# Patient Record
Sex: Female | Born: 1985 | Race: Black or African American | Hispanic: No | Marital: Married | Smoking: Never smoker
Health system: Southern US, Community
[De-identification: ages and names within clinical notes are randomized; demographics above are authoritative.]

## PROBLEM LIST (undated history)

## (undated) DIAGNOSIS — Z789 Other specified health status: Secondary | ICD-10-CM

## (undated) DIAGNOSIS — Z8041 Family history of malignant neoplasm of ovary: Secondary | ICD-10-CM

## (undated) DIAGNOSIS — Z803 Family history of malignant neoplasm of breast: Secondary | ICD-10-CM

## (undated) HISTORY — DX: Family history of malignant neoplasm of ovary: Z80.41

## (undated) HISTORY — PX: NO PAST SURGERIES: SHX2092

## (undated) HISTORY — DX: Family history of malignant neoplasm of breast: Z80.3

---
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
## 2006-09-17 ENCOUNTER — Encounter: Admission: RE | Admit: 2006-09-17 | Discharge: 2006-09-17 | Payer: Self-pay | Admitting: Family Medicine

## 2007-02-21 ENCOUNTER — Ambulatory Visit: Payer: Self-pay | Admitting: Internal Medicine

## 2007-10-09 ENCOUNTER — Inpatient Hospital Stay: Payer: Self-pay

## 2009-09-11 ENCOUNTER — Ambulatory Visit: Payer: Self-pay | Admitting: Obstetrics and Gynecology

## 2009-09-11 ENCOUNTER — Other Ambulatory Visit: Admission: RE | Admit: 2009-09-11 | Discharge: 2009-09-11 | Payer: Self-pay | Admitting: Obstetrics and Gynecology

## 2009-09-25 ENCOUNTER — Ambulatory Visit: Payer: Self-pay | Admitting: Obstetrics & Gynecology

## 2010-02-21 ENCOUNTER — Inpatient Hospital Stay (HOSPITAL_COMMUNITY): Admission: AD | Admit: 2010-02-21 | Discharge: 2010-02-21 | Payer: Self-pay | Admitting: Obstetrics & Gynecology

## 2010-03-19 ENCOUNTER — Ambulatory Visit: Payer: Self-pay | Admitting: Obstetrics and Gynecology

## 2010-03-21 ENCOUNTER — Encounter: Payer: Self-pay | Admitting: Obstetrics & Gynecology

## 2010-03-21 LAB — CONVERTED CEMR LAB
Clue Cells Wet Prep HPF POC: NONE SEEN
Trich, Wet Prep: NONE SEEN
WBC, Wet Prep HPF POC: NONE SEEN

## 2010-07-24 ENCOUNTER — Ambulatory Visit: Payer: Self-pay | Admitting: Family Medicine

## 2011-01-26 ENCOUNTER — Ambulatory Visit: Payer: Self-pay | Admitting: Internal Medicine

## 2011-02-23 LAB — POCT PREGNANCY, URINE: Preg Test, Ur: NEGATIVE

## 2011-02-23 LAB — URINALYSIS, ROUTINE W REFLEX MICROSCOPIC
Nitrite: NEGATIVE
Protein, ur: NEGATIVE mg/dL
Specific Gravity, Urine: 1.015 (ref 1.005–1.030)
Urobilinogen, UA: 0.2 mg/dL (ref 0.0–1.0)

## 2011-03-05 LAB — POCT PREGNANCY, URINE: Preg Test, Ur: NEGATIVE

## 2011-03-09 ENCOUNTER — Ambulatory Visit: Payer: Self-pay | Admitting: Internal Medicine

## 2011-03-30 ENCOUNTER — Ambulatory Visit: Payer: Self-pay | Admitting: Internal Medicine

## 2011-09-09 ENCOUNTER — Ambulatory Visit: Payer: Self-pay | Admitting: Family Medicine

## 2011-10-24 ENCOUNTER — Ambulatory Visit: Payer: Self-pay | Admitting: Internal Medicine

## 2011-11-05 ENCOUNTER — Ambulatory Visit: Payer: Self-pay | Admitting: Internal Medicine

## 2012-05-30 ENCOUNTER — Emergency Department: Payer: Self-pay | Admitting: Emergency Medicine

## 2012-06-04 ENCOUNTER — Emergency Department: Payer: Self-pay | Admitting: Emergency Medicine

## 2012-06-09 ENCOUNTER — Emergency Department: Payer: Self-pay | Admitting: Emergency Medicine

## 2012-10-16 ENCOUNTER — Observation Stay: Payer: Self-pay | Admitting: Obstetrics & Gynecology

## 2012-10-25 ENCOUNTER — Inpatient Hospital Stay: Payer: Self-pay | Admitting: Obstetrics and Gynecology

## 2012-10-25 LAB — CBC WITH DIFFERENTIAL/PLATELET
Basophil #: 0.1 10*3/uL (ref 0.0–0.1)
Eosinophil #: 0.2 10*3/uL (ref 0.0–0.7)
Lymphocyte %: 22.4 %
Monocyte %: 4.8 %
Neutrophil %: 70.1 %
Platelet: 233 10*3/uL (ref 150–440)
RBC: 4.14 10*6/uL (ref 3.80–5.20)
RDW: 18.1 % — ABNORMAL HIGH (ref 11.5–14.5)
WBC: 11.2 10*3/uL — ABNORMAL HIGH (ref 3.6–11.0)

## 2014-02-05 ENCOUNTER — Ambulatory Visit: Payer: Self-pay | Admitting: Physician Assistant

## 2014-02-05 LAB — URINALYSIS, COMPLETE
BILIRUBIN, UR: NEGATIVE
Glucose,UR: NEGATIVE mg/dL (ref 0–75)
Ketone: NEGATIVE
LEUKOCYTE ESTERASE: NEGATIVE
Nitrite: NEGATIVE
PROTEIN: NEGATIVE
Ph: 6 (ref 4.5–8.0)
SPECIFIC GRAVITY: 1.01 (ref 1.003–1.030)
WBC UR: NONE SEEN /HPF (ref 0–5)

## 2014-02-05 LAB — PREGNANCY, URINE: PREGNANCY TEST, URINE: NEGATIVE m[IU]/mL

## 2014-02-05 LAB — RAPID INFLUENZA A&B ANTIGENS

## 2014-02-07 LAB — URINE CULTURE

## 2014-05-28 ENCOUNTER — Emergency Department: Payer: Self-pay | Admitting: Emergency Medicine

## 2014-05-28 LAB — HEPATIC FUNCTION PANEL A (ARMC)
ALBUMIN: 3.4 g/dL (ref 3.4–5.0)
ALK PHOS: 97 U/L
Bilirubin,Total: 0.2 mg/dL (ref 0.2–1.0)
SGOT(AST): 39 U/L — ABNORMAL HIGH (ref 15–37)
SGPT (ALT): 19 U/L (ref 12–78)
Total Protein: 7.4 g/dL (ref 6.4–8.2)

## 2014-05-28 LAB — LIPASE, BLOOD: LIPASE: 208 U/L (ref 73–393)

## 2014-05-28 LAB — BASIC METABOLIC PANEL
Anion Gap: 5 — ABNORMAL LOW (ref 7–16)
BUN: 8 mg/dL (ref 7–18)
CALCIUM: 8.2 mg/dL — AB (ref 8.5–10.1)
CREATININE: 1 mg/dL (ref 0.60–1.30)
Chloride: 107 mmol/L (ref 98–107)
Co2: 28 mmol/L (ref 21–32)
EGFR (African American): >60
GLUCOSE: 92 mg/dL (ref 65–99)
Osmolality: 277 (ref 275–301)
Potassium: 3.6 mmol/L (ref 3.5–5.1)
Sodium: 140 mmol/L (ref 136–145)

## 2014-05-28 LAB — CBC
HCT: 35.8 % (ref 35.0–47.0)
HGB: 11.2 g/dL — AB (ref 12.0–16.0)
MCH: 23.1 pg — ABNORMAL LOW (ref 26.0–34.0)
MCHC: 31.3 g/dL — ABNORMAL LOW (ref 32.0–36.0)
MCV: 74 fL — ABNORMAL LOW (ref 80–100)
PLATELETS: 287 10*3/uL (ref 150–440)
RBC: 4.86 10*6/uL (ref 3.80–5.20)
RDW: 16.8 % — ABNORMAL HIGH (ref 11.5–14.5)
WBC: 15.2 10*3/uL — ABNORMAL HIGH (ref 3.6–11.0)

## 2014-05-28 LAB — URINALYSIS, COMPLETE
BILIRUBIN, UR: NEGATIVE
BLOOD: NEGATIVE
GLUCOSE, UR: NEGATIVE mg/dL (ref 0–75)
Ketone: NEGATIVE
Leukocyte Esterase: NEGATIVE
NITRITE: NEGATIVE
PROTEIN: NEGATIVE
Ph: 6 (ref 4.5–8.0)
RBC,UR: 2 /HPF (ref 0–5)
Specific Gravity: 1.014 (ref 1.003–1.030)
Squamous Epithelial: 1

## 2014-05-28 LAB — TROPONIN I: Troponin-I: <0.02 ng/mL

## 2014-06-17 ENCOUNTER — Ambulatory Visit: Payer: Self-pay

## 2014-10-03 ENCOUNTER — Ambulatory Visit: Payer: Self-pay | Admitting: Physician Assistant

## 2014-10-03 LAB — RAPID STREP-A WITH REFLX: Micro Text Report: NEGATIVE

## 2014-10-05 LAB — BETA STREP CULTURE(ARMC)

## 2015-04-09 NOTE — H&P (Signed)
L&D Evaluation:  History:   HPI 29yo G2P1001 at 2376w4d presents with c/o LOF and contractions.  Last cervical exam 1 week ago 1cm dilated.    Patient's Medical History obesity    Patient's Surgical History none    Medications Pre Serbiaatal Vitamins    Social History none    Family History Non-Contributory   ROS:   ROS All systems were reviewed.  HEENT, CNS, GI, GU, Respiratory, CV, Renal and Musculoskeletal systems were found to be normal.   Exam:   Vital Signs stable    General no apparent distress    Mental Status clear    Chest nl effort    Abdomen gravid, non-tender    Estimated Fetal Weight Average for gestational age    Pelvic 1 / 550 / -3 per RN, nitrazine negative    Mebranes Intact    FHT normal rate with no decels, reactive NST    Ucx irregular    Skin dry   Impression:   Impression False labor, not ruptured   Plan:   Plan reassurance provided, reviewed labor precautions    Follow Up Appointment already scheduled. tomorrow   Electronic Signatures: Garnette GunnerStansbury Clipp, Ali LoweEryn K (MD)  (Signed [phone]-Nov-13 13:28)  Authored: L&D Evaluation   Last Updated: 17-Nov-13 13:28 by Garnette GunnerStansbury Clipp, Ali LoweEryn K (MD)

## 2016-04-08 ENCOUNTER — Ambulatory Visit: Payer: Self-pay | Admitting: Family Medicine

## 2016-10-09 ENCOUNTER — Encounter (INDEPENDENT_AMBULATORY_CARE_PROVIDER_SITE_OTHER): Payer: Self-pay | Admitting: Podiatry

## 2016-10-09 ENCOUNTER — Ambulatory Visit: Payer: Self-pay

## 2016-10-09 DIAGNOSIS — M79671 Pain in right foot: Secondary | ICD-10-CM

## 2016-10-09 NOTE — Progress Notes (Signed)
This encounter was created in error - please disregard.

## 2017-01-05 ENCOUNTER — Ambulatory Visit: Payer: Self-pay | Admitting: Podiatry

## 2017-03-02 ENCOUNTER — Ambulatory Visit: Payer: Self-pay | Admitting: Podiatry

## 2017-05-11 ENCOUNTER — Encounter: Payer: Self-pay | Admitting: Podiatry

## 2017-06-01 ENCOUNTER — Ambulatory Visit: Payer: Self-pay | Admitting: Podiatry

## 2017-06-07 NOTE — Progress Notes (Signed)
This encounter was created in error - please disregard.

## 2018-07-15 ENCOUNTER — Other Ambulatory Visit (HOSPITAL_COMMUNITY)
Admission: RE | Admit: 2018-07-15 | Discharge: 2018-07-15 | Disposition: A | Discharge status: Home / Self Care | Payer: BC Managed Care – PPO | Source: Ambulatory Visit | Attending: Obstetrics and Gynecology | Admitting: Obstetrics and Gynecology

## 2018-07-15 ENCOUNTER — Ambulatory Visit (INDEPENDENT_AMBULATORY_CARE_PROVIDER_SITE_OTHER): Payer: BC Managed Care – PPO | Admitting: Obstetrics and Gynecology

## 2018-07-15 ENCOUNTER — Encounter: Payer: Self-pay | Admitting: Obstetrics and Gynecology

## 2018-07-15 VITALS — BP 110/76 | HR 78 | Ht 63.0 in | Wt 264.0 lb

## 2018-07-15 DIAGNOSIS — Z01419 Encounter for gynecological examination (general) (routine) without abnormal findings: Secondary | ICD-10-CM

## 2018-07-15 DIAGNOSIS — Z1239 Encounter for other screening for malignant neoplasm of breast: Secondary | ICD-10-CM

## 2018-07-15 DIAGNOSIS — Z1151 Encounter for screening for human papillomavirus (HPV): Secondary | ICD-10-CM | POA: Insufficient documentation

## 2018-07-15 DIAGNOSIS — Z1231 Encounter for screening mammogram for malignant neoplasm of breast: Secondary | ICD-10-CM | POA: Diagnosis not present

## 2018-07-15 DIAGNOSIS — Z0000 Encounter for general adult medical examination without abnormal findings: Secondary | ICD-10-CM

## 2018-07-15 DIAGNOSIS — Z124 Encounter for screening for malignant neoplasm of cervix: Secondary | ICD-10-CM | POA: Insufficient documentation

## 2018-07-15 NOTE — Progress Notes (Signed)
Gynecology Annual Exam   PCP: Marina GoodellFeldpausch, Dale E, MD  Chief Complaint:  Chief Complaint  Patient presents with  . Gynecologic Exam    History of Present Illness: Patient is a 32 y.o. [id number] presents for annual exam. The patient has no complaints today.   LMP: No LMP recorded (lmp unknown). (Menstrual status: Irregular Periods). Average Interval: regular, 30 days Duration of flow: 4 days Heavy Menses: yes Clots: no Intermenstrual Bleeding: no Postcoital Bleeding: no Dysmenorrhea: no  The patient is sexually active. She currently uses rhythm method for contraception. She denies dyspareunia.  The patient does perform self breast exams.  There is notable family history of breast or ovarian cancer in her family.  The patient wears seatbelts: yes.   The patient has regular exercise: no.    The patient denies current symptoms of depression.    Review of Systems: Review of Systems  Constitutional: Positive for malaise/fatigue. Negative for chills, fever and weight loss.  HENT: Negative for congestion, hearing loss and sinus pain.   Eyes: Negative for blurred vision and double vision.  Respiratory: Negative for cough, sputum production, shortness of breath and wheezing.   Cardiovascular: Negative for chest pain, palpitations, orthopnea and leg swelling.  Gastrointestinal: Negative for abdominal pain, constipation, diarrhea, nausea and vomiting.  Genitourinary: Negative for dysuria, flank pain, frequency, hematuria and urgency.  Musculoskeletal: Negative for back pain, falls and joint pain.  Skin: Negative for itching and rash.  Neurological: Negative for dizziness and headaches.  Psychiatric/Behavioral: Negative for depression, substance abuse and suicidal ideas. The patient is not nervous/anxious.     Past Medical History:  History reviewed. No pertinent past medical history.  Past Surgical History:  History reviewed. No pertinent surgical history.  Gynecologic History:   No LMP recorded (lmp unknown). (Menstrual status: Irregular Periods). Contraception: rhythm method Last Pap: Results were: unknown  Obstetric History: [id number]  Family History:  Family History  Problem Relation Age of Onset  . Breast cancer Maternal Aunt 30  . Ovarian cancer Maternal Grandmother [id number]  . Breast cancer Maternal Aunt 30  . Breast cancer Maternal Aunt 30    Social History:  Social History   Socioeconomic History  . Marital status: Married    Spouse name: Not on file  . Number of children: Not on file  . Years of education: Not on file  . Highest education level: Not on file  Occupational History  . Not on file  Social Needs  . Financial resource strain: Not on file  . Food insecurity:    Worry: Not on file    Inability: Not on file  . Transportation needs:    Medical: Not on file    Non-medical: Not on file  Tobacco Use  . Smoking status: Never Smoker  . Smokeless tobacco: Never Used  Substance and Sexual Activity  . Alcohol use: Not Currently  . Drug use: Never  . Sexual activity: Yes    Birth control/protection: None  Lifestyle  . Physical activity:    Days per week: Not on file    Minutes per session: Not on file  . Stress: Not on file  Relationships  . Social connections:    Talks on phone: Not on file    Gets together: Not on file    Attends religious service: Not on file    Active member of club or organization: Not on file    Attends meetings of clubs or organizations: Not on file  Relationship status: Not on file  . Intimate partner violence:    Fear of current or ex partner: Not on file    Emotionally abused: Not on file    Physically abused: Not on file    Forced sexual activity: Not on file  Other Topics Concern  . Not on file  Social History Narrative  . Not on file    Allergies:  No Known Allergies  Medications: Prior to Admission medications   Medication Sig Start Date End Date Taking? Authorizing Provider  Multiple  Vitamin (MULTI-VITAMINS) TABS Take by mouth.   Yes [provider]    Physical Exam Vitals: Blood pressure 110/76, pulse 78, height 5\' 3"  (1.6 m), weight 264 lb (119.7 kg).  General: NAD HEENT: normocephalic, anicteric Thyroid: no enlargement, no palpable nodules Pulmonary: No increased work of breathing, CTAB Cardiovascular: RRR, distal pulses 2+ Breast: Breast symmetrical, no tenderness, no palpable nodules or masses, no skin or nipple retraction present, no nipple discharge.  No axillary or supraclavicular lymphadenopathy. Abdomen: NABS, soft, non-tender, non-distended.  Umbilicus without lesions.  No hepatomegaly, splenomegaly or masses palpable. No evidence of hernia  Genitourinary:  External: Normal external female genitalia.  Normal urethral meatus, normal Bartholin's and Skene's glands.    Vagina: Normal vaginal mucosa, no evidence of prolapse.    Cervix: Grossly normal in appearance, no bleeding  Uterus: Non-enlarged, mobile, normal contour.  No CMT  Adnexa: ovaries non-enlarged, no adnexal masses  Rectal: deferred  Lymphatic: no evidence of inguinal lymphadenopathy Extremities: no edema, erythema, or tenderness Neurologic: Grossly intact Psychiatric: mood appropriate, affect full  Female chaperone present for pelvic and breast  portions of the physical exam    Assessment: 32 y.o. [id number] routine annual exam  Plan: Problem List Items Addressed This Visit    None    Visit Diagnoses    Screening for cervical cancer    -  Primary   Relevant Orders   Cytology - PAP   Health care maintenance       Relevant Orders   Cytology - PAP   Visit for pelvic exam       Encounter for screening breast examination          2) STI screening  was offered and declined  2)  ASCCP guidelines and rational discussed.  Patient opts for every 3 years screening interval  3) Contraception - the patient is currently using  rhythm method.  She is happy with her current form of  contraception and plans to continue  4) Routine healthcare maintenance including cholesterol, diabetes screening discussed managed by PCP  5) Return in about 1 year (around 07/16/2019) for next week for nurse visit with Selena BattenKim, 1 year for annual .  6)  She was offered genetic testing. She is interested in obtaining this for her family history of breast cancer. She will return to have this completed.   Adelene Idlerhristanna Lorrie Strauch MD [facility] 07/15/18 2:00 PM

## 2018-07-15 NOTE — Patient Instructions (Signed)
Eating Healthy on a Budget There are many ways to save money at the grocery store and continue to eat healthy. You can be successful if you plan your meals according to your budget, purchase according to your budget and grocery list, and prepare food yourself. How can I buy more food on a limited budget? Plan  Plan meals and snacks according to a grocery list and budget you create.  Look for recipes where you can cook once and make enough food for two meals.  Include meals that will "stretch" more expensive foods such as stews, casseroles, and stir-fry dishes.  Make a grocery list and make sure to bring it with you to the store. If you have a smart phone, you could use your phone to create your shopping list. Purchase  When grocery shopping, buy only the items on your grocery list and go only to the areas of the store that have the items on your list. Prepare  Some meal items can be prepared in advance. Pre-cook on days when you have extra time.  Make extra food (such as by doubling recipes) and freeze the extras in meal-sized containers or in individual portions for fast meals and snacks.  Use leftovers in your meal plan for the week.  Try some meatless meals or try "no cook" meals like salads.  When you come home from the grocery store, wash and prepare your fruits and vegetables so they are ready to use and eat. This will help reduce food waste. How can I buy more food on a limited budget? Try these tips the next time you go shopping:  Buy store brands or generic brands.  Use coupons only for foods and brands you normally buy. Avoid buying items you wouldn't normally buy simply because they are on sale.  Check online and in newspapers for weekly deals.  Buy healthy items from the bulk bins when available, such as herbs, spices, flours, pastas, nuts, and dried fruit.  Buy fruits and vegetables that are in season. Prices are usually lower on in-season produce.  Compare and  contrast different items. You can do this by looking at the unit price on the price tag. Use it to compare different brands and sizes to find out which item is the best deal.  Choose naturally low-cost healthy items, such as carrots, potatoes, apples, bananas, and oranges. Dried or canned beans are a low-cost protein source.  Buy in bulk and freeze extra food. Items you can buy in bulk include meats, fish, poultry, frozen fruits, and frozen vegetables.  Limit the purchase of prepared or "ready-to-eat" foods, such as pre-cut fruits and vegetables and pre-made salads.  If possible, shop around to discover which grocery store offers the best prices. Some stores charge much more than other stores for the same items.  Do not shop when you are hungry. If you shop while hungry, It may be hard to stick to your list and budget.  Stick to your list and resist impulse buys. Treat your list as your official plan for the week.  Buy a variety of vegetables and fruit by purchasing fresh, frozen, and canned items.  Look beyond eye level. Foods at eye level (adult or child eye level) are more expensive. Look at the top and bottom shelves for deals.  Be efficient with your time when shopping. The more time you spend at the store, the more money you are likely to spend.  Consider other retailers such as dollar stores, larger wholesale   stores, local fruit and vegetable stands, and farmers markets.  What are some tips for less expensive food substitutions? When choosing more expensive foods like meats and dairy, try these tips to save money:  Choose cheaper cuts of meat, such as bone-in chicken thighs and drumsticks instead skinless and boneless chicken. When you are ready to prepare the chicken, you can remove the skin yourself to make it healthier.  Choose lean meats like chicken or turkey. When choosing ground beef, make sure it is lean ground beef (92% lean, 8% fat). If you do buy a fattier ground beef,  drain the fat before eating.  Buy dried beans and peas, such as lentils, split peas, or kidney beans.  For seafood, choose canned tuna, salmon, or sardines.  Eggs are a low-cost source of protein.  Buy the larger tubs of yogurt instead of individual-sized containers.  Choose water instead of sodas and other sweetened beverages.  Skip buying chips, cookies, and other "junk food". These items are usually expensive, high in calories, and low in nutritional value.  How can I prepare the foods I buy in the healthiest way? Practice these tips for cooking foods in the healthiest way to reduce excess fat and calorie intake:  Steam, saute, grill, or bake foods instead of frying them.  Make sure half your plate is filled with fruits or vegetables. Choose from fresh, frozen, or canned fruits and vegetables. If eating canned, remember to rinse them before eating. This will remove any excess salt added for packaging.  Trim all fat from meat before cooking. Remove the skin from chicken or turkey.  Spoon off fat from meat dishes once they have been chilled in the refrigerator and the fat has hardened on the top.  Use skim milk, low-fat milk, or evaporated skim milk when making cream sauces, soups, or puddings.  Substitute low-fat yogurt, sour cream, or cottage cheese for sour cream and mayonnaise in dips and dressings.  Try lemon juice, herbs, or spices to season food instead of salt, butter, or margarine.  This information is not intended to replace advice given to you by your health care provider. Make sure you discuss any questions you have with your health care provider. Document Released: 07/20/2014 Document Revised: 06/05/2016 Document Reviewed: 06/19/2014 Elsevier Interactive Patient Education  2018 Elsevier Inc.  

## 2018-07-19 LAB — CYTOLOGY - PAP
DIAGNOSIS: NEGATIVE
HPV (WINDOPATH): NOT DETECTED

## 2018-07-19 NOTE — Progress Notes (Signed)
NIL HPV neg, released to mychart.

## 2018-07-20 ENCOUNTER — Encounter: Payer: Self-pay | Admitting: Obstetrics and Gynecology

## 2018-10-14 ENCOUNTER — Encounter: Payer: Self-pay | Admitting: Obstetrics and Gynecology

## 2018-10-14 ENCOUNTER — Ambulatory Visit (INDEPENDENT_AMBULATORY_CARE_PROVIDER_SITE_OTHER): Payer: BC Managed Care – PPO | Admitting: Obstetrics and Gynecology

## 2018-10-14 ENCOUNTER — Other Ambulatory Visit: Payer: Self-pay | Admitting: Obstetrics and Gynecology

## 2018-10-14 VITALS — BP 110/70 | HR 59 | Ht 63.0 in | Wt 264.0 lb

## 2018-10-14 DIAGNOSIS — N644 Mastodynia: Secondary | ICD-10-CM

## 2018-10-14 DIAGNOSIS — Z131 Encounter for screening for diabetes mellitus: Secondary | ICD-10-CM

## 2018-10-14 DIAGNOSIS — R3989 Other symptoms and signs involving the genitourinary system: Secondary | ICD-10-CM

## 2018-10-14 DIAGNOSIS — N926 Irregular menstruation, unspecified: Secondary | ICD-10-CM | POA: Diagnosis not present

## 2018-10-14 DIAGNOSIS — Z0000 Encounter for general adult medical examination without abnormal findings: Secondary | ICD-10-CM

## 2018-10-14 LAB — POCT URINE PREGNANCY: PREG TEST UR: NEGATIVE

## 2018-10-14 NOTE — Progress Notes (Signed)
Patient ID: Anna LoftsLutisha Pyles Tallarico, female   DOB: 26-Oct-1986, 32 y.o.   MRN: [id number]  Reason for Consult: Consult (Headaches/diarrhea and constipation/ nausea/ moody/ blood work PCOS)   Referred by Marina GoodellFeldpausch, Dale E, MD  Subjective:     HPI:   Anna Baldwin is a 32 y.o. female she presents today for a visit. She had started to have irregular periods. She is unsure of her exact LMP but it was more than 1 month ago. She says she has never been able to know when her period would come. Sometimes it would be early and sometimes late. She reports she usually had one each month.   She has had increased urinary frequency. She goes to the bathroom every 2 hours without drinking a lot of water or other beverages. She wakes up 2-3 times at night to pee. She notes that she can go a full day not peeing when doing activities.   She has joined [employer]. She lost 5 lbs but now has regained them. Finds it difficult to record the food she eats.   She notes breast tenderness and increased cervical mucus that has been present for several weeks.   Past Medical History:  Diagnosis Date  . Family history of breast cancer   . Family history of ovarian cancer    Family History  Problem Relation Age of Onset  . Breast cancer Maternal Aunt 30  . Ovarian cancer Maternal Grandmother 762  . Breast cancer Maternal Aunt 30  . Breast cancer Maternal Aunt 30   History reviewed. No pertinent surgical history.  Short Social History:  Social History   Tobacco Use  . Smoking status: Never Smoker  . Smokeless tobacco: Never Used  Substance Use Topics  . Alcohol use: Not Currently    No Known Allergies  Current Outpatient Medications  Medication Sig Dispense Refill  . Ascorbic Acid (VITAMIN C) 1000 MG tablet Take 1,000 mg by mouth daily.    . Multiple Vitamin (MULTI-VITAMINS) TABS Take by mouth.     No current facility-administered medications for this visit.     Review of Systems   Constitutional: Negative for chills, fatigue, fever and unexpected weight change.  HENT: Negative for trouble swallowing.  Eyes: Negative for loss of vision.  Respiratory: Negative for cough, shortness of breath and wheezing.  Cardiovascular: Negative for chest pain, leg swelling, palpitations and syncope.  GI: Positive for diarrhea. Negative for abdominal pain, blood in stool, nausea and vomiting.  GU: Positive for frequency. Negative for difficulty urinating, dysuria and hematuria.  Musculoskeletal: Negative for back pain, leg pain and joint pain.  Skin: Negative for rash.  Neurological: Negative for dizziness, headaches, light-headedness, numbness and seizures.  Psychiatric: Negative for behavioral problem, confusion, depressed mood and sleep disturbance.       Objective:  Objective   Vitals:   10/14/18 0854  BP: 110/70  Pulse: (!) 59  Weight: 264 lb (119.7 kg)  Height: 5\' 3"  (1.6 m)   Body mass index is 46.77 kg/m.  Physical Exam  Constitutional: She is oriented to person, place, and time. She appears well-developed and well-nourished.  HENT:  Head: Normocephalic and atraumatic.  Eyes: Pupils are equal, round, and reactive to light. EOM are normal.  Cardiovascular: Normal rate and regular rhythm.  Pulmonary/Chest: Effort normal. No respiratory distress.  Neurological: She is alert and oriented to person, place, and time.  Skin: Skin is warm and dry.  Psychiatric: She has a normal mood and  affect. Her behavior is normal. Judgment and thought content normal.  Nursing note and vitals reviewed.       Assessment/Plan:     32 yo here for a consult visit.  1. Irregular periods- urine pregnancy test negative, will evaluate for secondary amenorrhea with labs and US/ 2. Health care maintenance - will screen for diabetes 3. Urinary frequency- discussed possible voiding diary.  4. Breast tenderness- will discuss plan of management at next visit.    Adelene Idler  MD [facility], [facility] 10/14/18 4:36 PM

## 2018-10-18 LAB — NUSWAB VAGINITIS PLUS (VG+)
CANDIDA ALBICANS, NAA: NEGATIVE
Candida glabrata, NAA: NEGATIVE
Chlamydia trachomatis, NAA: NEGATIVE
NEISSERIA GONORRHOEAE, NAA: NEGATIVE
Trich vag by NAA: NEGATIVE

## 2018-10-20 LAB — BASIC METABOLIC PANEL
BUN / CREAT RATIO: 13 (ref 9–23)
BUN: 10 mg/dL (ref 6–20)
CHLORIDE: 102 mmol/L (ref 96–106)
CO2: 25 mmol/L (ref 20–29)
Calcium: 9.5 mg/dL (ref 8.7–10.2)
Creatinine, Ser: 0.77 mg/dL (ref 0.57–1.00)
GFR calc Af Amer: 120 mL/min/{1.73_m2} (ref >59)
GFR calc non Af Amer: 104 mL/min/{1.73_m2} (ref >59)
GLUCOSE: 87 mg/dL (ref 65–99)
Potassium: 4.6 mmol/L (ref 3.5–5.2)
Sodium: 140 mmol/L (ref 134–144)

## 2018-10-20 LAB — TSH+PRL+FSH+TESTT+LH+DHEA S...
17 HYDROXYPROGESTERONE: 34 ng/dL
ANDROSTENEDIONE: 105 ng/dL (ref 41–262)
DHEA SO4: 136.8 ug/dL (ref 84.8–378.0)
FSH: 5.6 m[IU]/mL
LH: 11.8 m[IU]/mL
Prolactin: 10 ng/mL (ref 4.8–23.3)
TSH: 2.78 u[IU]/mL (ref 0.450–4.500)
Testosterone, Free: 1.1 pg/mL (ref 0.0–4.2)
Testosterone: 21 ng/dL (ref 8–48)

## 2018-10-20 LAB — CBC
Hematocrit: 39.1 % (ref 34.0–46.6)
Hemoglobin: 12.3 g/dL (ref 11.1–15.9)
MCH: 24.8 pg — AB (ref 26.6–33.0)
MCHC: 31.5 g/dL (ref 31.5–35.7)
MCV: 79 fL (ref 79–97)
PLATELETS: 367 10*3/uL (ref 150–450)
RBC: 4.95 x10E6/uL (ref 3.77–5.28)
RDW: 14.8 % (ref 12.3–15.4)
WBC: 9.7 10*3/uL (ref 3.4–10.8)

## 2018-10-20 LAB — HEMOGLOBIN A1C
ESTIMATED AVERAGE GLUCOSE: 114 mg/dL
Hgb A1c MFr Bld: 5.6 % (ref 4.8–5.6)

## 2018-10-21 ENCOUNTER — Ambulatory Visit (INDEPENDENT_AMBULATORY_CARE_PROVIDER_SITE_OTHER): Payer: BC Managed Care – PPO | Admitting: Obstetrics and Gynecology

## 2018-10-21 ENCOUNTER — Encounter: Payer: Self-pay | Admitting: Obstetrics and Gynecology

## 2018-10-21 ENCOUNTER — Ambulatory Visit (INDEPENDENT_AMBULATORY_CARE_PROVIDER_SITE_OTHER): Payer: BC Managed Care – PPO

## 2018-10-21 ENCOUNTER — Other Ambulatory Visit: Payer: Self-pay | Admitting: Obstetrics and Gynecology

## 2018-10-21 VITALS — BP 110/72 | Ht 63.0 in | Wt 268.0 lb

## 2018-10-21 DIAGNOSIS — N939 Abnormal uterine and vaginal bleeding, unspecified: Secondary | ICD-10-CM

## 2018-10-21 DIAGNOSIS — N926 Irregular menstruation, unspecified: Secondary | ICD-10-CM

## 2018-10-21 NOTE — Progress Notes (Signed)
Patient ID: Anna Baldwin, female   DOB: February 20, 1986, 32 y.o.   MRN: [id number]  Reason for Consult: Follow-up   Referred by Marina Goodell, MD  Subjective:     HPI:  Anna Baldwin is a 32 y.o. female  She is following up for irregular periods and secondary amenorrhea. She had labs last week and returns today for pelvic ultrasound. She reports that over the weekend she started her period and it lasted for 3 days. She was happy and relieved by that. Her lab work was normal and today's ultrasound is also normal. She would not like to evaluate her urinary frequency at this time, she reports that she has just been drinking a lot of water.   Past Medical History:  Diagnosis Date  . Family history of breast cancer   . Family history of ovarian cancer    Family History  Problem Relation Age of Onset  . Breast cancer Maternal Aunt 30  . Ovarian cancer Maternal Grandmother 16  . Breast cancer Maternal Aunt 30  . Breast cancer Maternal Aunt 30   History reviewed. No pertinent surgical history.  Short Social History:  Social History   Tobacco Use  . Smoking status: Never Smoker  . Smokeless tobacco: Never Used  Substance Use Topics  . Alcohol use: Not Currently    No Known Allergies  Current Outpatient Medications  Medication Sig Dispense Refill  . Ascorbic Acid (VITAMIN C) 1000 MG tablet Take 1,000 mg by mouth daily.    . Multiple Vitamin (MULTI-VITAMINS) TABS Take by mouth.     No current facility-administered medications for this visit.     Review of Systems  Constitutional: Negative for chills, fatigue, fever and unexpected weight change.  HENT: Negative for trouble swallowing.  Eyes: Negative for loss of vision.  Respiratory: Negative for cough, shortness of breath and wheezing.  Cardiovascular: Negative for chest pain, leg swelling, palpitations and syncope.  GI: Negative for abdominal pain, blood in stool, diarrhea, nausea and vomiting.  GU: Negative  for difficulty urinating, dysuria, frequency and hematuria.  Musculoskeletal: Negative for back pain, leg pain and joint pain.  Skin: Negative for rash.  Neurological: Negative for dizziness, headaches, light-headedness, numbness and seizures.  Psychiatric: Negative for behavioral problem, confusion, depressed mood and sleep disturbance.        Objective:  Objective   Vitals:   10/21/18 1051  BP: 110/72  Weight: 268 lb (121.6 kg)  Height: 5\' 3"  (1.6 m)   Body mass index is 47.47 kg/m.  Physical Exam  Constitutional: She is oriented to person, place, and time. She appears well-developed and well-nourished.  HENT:  Head: Normocephalic and atraumatic.  Eyes: Pupils are equal, round, and reactive to light. EOM are normal.  Cardiovascular: Normal rate and regular rhythm.  Pulmonary/Chest: Effort normal. No respiratory distress.  Neurological: She is alert and oriented to person, place, and time.  Skin: Skin is warm and dry.  Psychiatric: She has a normal mood and affect. Her behavior is normal. Judgment and thought content normal.  Nursing note and vitals reviewed.      Assessment/Plan:       32 yo being evaluated for amenorrhea.  She had her period this weekend. PCOS work up was normal. She had a normal pelvic ultrasound.  Will start recording the first days of her periods so the number of days between her cycles can be determined. She will return if needed. She will return if she desires to evaluate  her urinary frequency.   More than 10 minutes were spent face to face with the patient in the room with more than 50% of the time spent providing counseling and discussing the plan of management. We discussed the results of her labs and ultrasound.    Adelene Idlerhristanna  MD [facility], [facility] 10/21/18 11:29 AM

## 2018-11-01 ENCOUNTER — Encounter: Payer: Self-pay | Admitting: Obstetrics and Gynecology

## 2019-07-28 ENCOUNTER — Other Ambulatory Visit: Payer: Self-pay

## 2019-07-28 ENCOUNTER — Encounter: Payer: Self-pay | Admitting: Obstetrics and Gynecology

## 2019-07-28 ENCOUNTER — Ambulatory Visit (INDEPENDENT_AMBULATORY_CARE_PROVIDER_SITE_OTHER): Payer: BC Managed Care – PPO | Admitting: Obstetrics and Gynecology

## 2019-07-28 VITALS — Wt 282.8 lb

## 2019-07-28 DIAGNOSIS — N926 Irregular menstruation, unspecified: Secondary | ICD-10-CM | POA: Diagnosis not present

## 2019-07-28 LAB — POCT URINE PREGNANCY: Preg Test, Ur: POSITIVE — AB

## 2019-07-28 NOTE — Progress Notes (Signed)
HPI:      [name] is a 33 y.o. [id number] who LMP was Patient's last menstrual period was 06/19/2019.  Subjective:   She presents today for pregnancy confirmation.  She was not trying but is excited about being pregnant.  Her youngest child is currently 527 and she also has a 32 year old.  She reports no problems with those pregnancies they ended in induced vaginal deliveries at term.  She reports no previous issues with diabetes or hypertension. She is currently taking prenatal vitamins. She has occasional nausea and breast tenderness but she is not experiencing any significant problems from this.    Hx: The following portions of the patient's history were reviewed and updated as appropriate:             She  has a past medical history of Family history of breast cancer and Family history of ovarian cancer. She does not have a problem list on file. She  has no past surgical history on file. Her family history includes Breast cancer (age of onset: 6730) in her maternal aunt, maternal aunt, and maternal aunt; Ovarian cancer (age of onset: 7662) in her maternal grandmother. She  reports that she has never smoked. She has never used smokeless tobacco. She reports previous alcohol use. She reports that she does not use drugs. She has a current medication list which includes the following prescription(s): vitamin c and prenatal multivitamin. She has No Known Allergies.       Review of Systems:  Review of Systems  Constitutional: Denied constitutional symptoms, night sweats, recent illness, fatigue, fever, insomnia and weight loss.  Eyes: Denied eye symptoms, eye pain, photophobia, vision change and visual disturbance.  Ears/Nose/Throat/Neck: Denied ear, nose, throat or neck symptoms, hearing loss, nasal discharge, sinus congestion and sore throat.  Cardiovascular: Denied cardiovascular symptoms, arrhythmia, chest pain/pressure, edema, exercise intolerance, orthopnea and palpitations.   Respiratory: Denied pulmonary symptoms, asthma, pleuritic pain, productive sputum, cough, dyspnea and wheezing.  Gastrointestinal: Denied, gastro-esophageal reflux, melena, nausea and vomiting.  Genitourinary: Denied genitourinary symptoms including symptomatic vaginal discharge, pelvic relaxation issues, and urinary complaints.  Musculoskeletal: Denied musculoskeletal symptoms, stiffness, swelling, muscle weakness and myalgia.  Dermatologic: Denied dermatology symptoms, rash and scar.  Neurologic: Denied neurology symptoms, dizziness, headache, neck pain and syncope.  Psychiatric: Denied psychiatric symptoms, anxiety and depression.  Endocrine: Denied endocrine symptoms including hot flashes and night sweats.   Meds:   Current Outpatient Medications on File Prior to Visit  Medication Sig Dispense Refill  . Ascorbic Acid (VITAMIN C) 1000 MG tablet Take 1,000 mg by mouth daily.    . Prenatal Vit-Fe Fumarate-FA (PRENATAL MULTIVITAMIN) TABS tablet Take 1 tablet by mouth daily at 12 noon.     No current facility-administered medications on file prior to visit.     Objective:     There were no vitals filed for this visit.            Urinary pregnancy test positive  Assessment:    G3P2012 There are no active problems to display for this patient.    1. Missed periods   2. Morbid obesity (HCC)        Plan:            Prenatal Plan 1.  The patient was given prenatal literature. 2.  She was continued on prenatal vitamins. 3.  A prenatal lab panel was ordered or drawn. 4.  An ultrasound was ordered to better determine an EDC. 5.  A nurse visit  was scheduled. 6.  Genetic testing and testing for other inheritable conditions discussed in detail. She will decide in the future whether to have these labs performed. 7.  A general overview of pregnancy testing, visit schedule, ultrasound schedule, and prenatal care was discussed. 8.  Benefits of breast-feeding discussed in detail  including both maternal and infant benefits. Ready Set Baby website discussed. 9.  Morbid obesity in pregnancy discussed in detail- anesthesia consult necessity discussed.   Orders Orders Placed This Encounter  Procedures  . POCT urine pregnancy    No orders of the defined types were placed in this encounter.     F/U  Return in about 7 weeks (around 09/15/2019). I spent 32 minutes involved in the care of this patient of which greater than 50% was spent discussing above prenatal plan, first trimester ultrasound, genetic testing, previous pregnancy history, overview of pregnancy in this practice.  All questions answered.  Finis Bud, M.D. 07/28/2019 11:29 AM

## 2019-07-28 NOTE — Progress Notes (Signed)
Patient comes in today for pregnancy confirmation. LMP 06/19/2019. EDD 03/25/2020. She is taking prenatal vitamins. She is having nausea and breast tenderness.

## 2019-08-02 ENCOUNTER — Other Ambulatory Visit: Payer: Self-pay

## 2019-08-02 ENCOUNTER — Ambulatory Visit (INDEPENDENT_AMBULATORY_CARE_PROVIDER_SITE_OTHER): Payer: BC Managed Care – PPO

## 2019-08-02 DIAGNOSIS — N8311 Corpus luteum cyst of right ovary: Secondary | ICD-10-CM | POA: Diagnosis not present

## 2019-08-02 DIAGNOSIS — Z3A01 Less than 8 weeks gestation of pregnancy: Secondary | ICD-10-CM

## 2019-08-02 DIAGNOSIS — O3481 Maternal care for other abnormalities of pelvic organs, first trimester: Secondary | ICD-10-CM

## 2019-08-02 DIAGNOSIS — N926 Irregular menstruation, unspecified: Secondary | ICD-10-CM

## 2019-08-18 ENCOUNTER — Other Ambulatory Visit: Payer: Self-pay

## 2019-08-18 ENCOUNTER — Ambulatory Visit (INDEPENDENT_AMBULATORY_CARE_PROVIDER_SITE_OTHER): Payer: BC Managed Care – PPO | Admitting: Obstetrics and Gynecology

## 2019-08-18 VITALS — BP 113/72 | HR 79 | Ht 63.0 in | Wt 291.2 lb

## 2019-08-18 DIAGNOSIS — Z3491 Encounter for supervision of normal pregnancy, unspecified, first trimester: Secondary | ICD-10-CM

## 2019-08-18 LAB — OB RESULTS CONSOLE VARICELLA ZOSTER ANTIBODY, IGG: Varicella: IMMUNE

## 2019-08-18 NOTE — Progress Notes (Signed)
Ellison Carwin Pyles Pollok presents for NOB nurse interview visit. Pregnancy confirmation done 07/28/2019. G3 P2002. Pregnancy education material explained and given. 0 cats in home. NOB labs ordered. TSH/HbgA1c ordered due to BMI 30 or greater. Sickle cell ordered due to patient's race. HIV labs and drug screen were explained and ordered. PNV encouraged. Genetic screening options discussed. Genetic testing:Unsure. Patient may discuss with the provider. Patient to follow up with provider on 09/08/2019 for NOB physical. All questions answered.  It is in patients chart that she had a miscarriage. Patient stated that she never had one.

## 2019-08-19 LAB — URINALYSIS, ROUTINE W REFLEX MICROSCOPIC
Bilirubin, UA: NEGATIVE
Glucose, UA: NEGATIVE
Ketones, UA: NEGATIVE
Leukocytes,UA: NEGATIVE
Nitrite, UA: NEGATIVE
Protein,UA: NEGATIVE
RBC, UA: NEGATIVE
Specific Gravity, UA: 1.014 (ref 1.005–1.030)
Urobilinogen, Ur: 0.2 mg/dL (ref 0.2–1.0)
pH, UA: 7 (ref 5.0–7.5)

## 2019-08-20 LAB — CULTURE, OB URINE

## 2019-08-20 LAB — URINE CULTURE, OB REFLEX

## 2019-08-21 LAB — MONITOR DRUG PROFILE 14(MW)
Amphetamine Scrn, Ur: NEGATIVE ng/mL
BARBITURATE SCREEN URINE: NEGATIVE ng/mL
BENZODIAZEPINE SCREEN, URINE: NEGATIVE ng/mL
Buprenorphine, Urine: NEGATIVE ng/mL
CANNABINOIDS UR QL SCN: NEGATIVE ng/mL
Cocaine (Metab) Scrn, Ur: NEGATIVE ng/mL
Creatinine(Crt), U: 95.2 mg/dL (ref 20.0–300.0)
Fentanyl, Urine: NEGATIVE pg/mL
Meperidine Screen, Urine: NEGATIVE ng/mL
Methadone Screen, Urine: NEGATIVE ng/mL
OXYCODONE+OXYMORPHONE UR QL SCN: NEGATIVE ng/mL
Opiate Scrn, Ur: NEGATIVE ng/mL
Ph of Urine: 6.7 (ref 4.5–8.9)
Phencyclidine Qn, Ur: NEGATIVE ng/mL
Propoxyphene Scrn, Ur: NEGATIVE ng/mL
SPECIFIC GRAVITY: 1.015
Tramadol Screen, Urine: NEGATIVE ng/mL

## 2019-08-22 LAB — VARICELLA ZOSTER ANTIBODY, IGG: Varicella zoster IgG: 2409 index (ref >165)

## 2019-08-22 LAB — HEMOGLOBIN FRAC.W/O SOLUBILITY
HGB C: 0 %
HGB S: 32.9 % — ABNORMAL HIGH
HGB VARIANT: 0 %
Hemoglobin A2 Quantitation: 3.9 % — ABNORMAL HIGH (ref 1.8–3.2)
Hemoglobin F Quantitation: 0 % (ref 0.0–2.0)
Hgb A: 63.2 % — ABNORMAL LOW (ref 96.4–98.8)

## 2019-08-22 LAB — RPR: RPR Ser Ql: NONREACTIVE

## 2019-08-22 LAB — RUBELLA SCREEN: Rubella Antibodies, IGG: 1.7 index (ref >0.99)

## 2019-08-22 LAB — ABO AND RH: Rh Factor: POSITIVE

## 2019-08-22 LAB — HGB SOLU + RFLX FRAC: Sickle Solubility Test - HGBRFX: POSITIVE — AB

## 2019-08-22 LAB — HEPATITIS B SURFACE ANTIGEN: Hepatitis B Surface Ag: NEGATIVE

## 2019-08-22 LAB — ANTIBODY SCREEN: Antibody Screen: NEGATIVE

## 2019-08-22 LAB — HIV ANTIBODY (ROUTINE TESTING W REFLEX): HIV Screen 4th Generation wRfx: NONREACTIVE

## 2019-08-23 LAB — GC/CHLAMYDIA PROBE AMP
Chlamydia trachomatis, NAA: NEGATIVE
Neisseria Gonorrhoeae by PCR: NEGATIVE

## 2019-08-31 ENCOUNTER — Telehealth: Payer: Self-pay | Admitting: Obstetrics and Gynecology

## 2019-08-31 NOTE — Telephone Encounter (Signed)
The patient called and stated that she needs to confirm with a nurse if it is okay for her to take a certain medication to help her sleep. Pt requesting a call back. Please advise.

## 2019-08-31 NOTE — Telephone Encounter (Signed)
Spoke with patient and she wanted to know if it was ok to take magnesium for sleep. I told her that she can take unisom. I do not see magnesium on the safe med sheet. Patient verbalized understanding.

## 2019-09-08 ENCOUNTER — Encounter: Payer: Self-pay | Admitting: Obstetrics and Gynecology

## 2019-09-08 ENCOUNTER — Telehealth: Payer: Self-pay | Admitting: Obstetrics and Gynecology

## 2019-09-08 ENCOUNTER — Ambulatory Visit (INDEPENDENT_AMBULATORY_CARE_PROVIDER_SITE_OTHER): Payer: BC Managed Care – PPO | Admitting: Obstetrics and Gynecology

## 2019-09-08 ENCOUNTER — Other Ambulatory Visit: Payer: Self-pay

## 2019-09-08 VITALS — BP 94/63 | HR 85 | Wt 282.3 lb

## 2019-09-08 DIAGNOSIS — O99211 Obesity complicating pregnancy, first trimester: Secondary | ICD-10-CM

## 2019-09-08 DIAGNOSIS — Z3491 Encounter for supervision of normal pregnancy, unspecified, first trimester: Secondary | ICD-10-CM

## 2019-09-08 DIAGNOSIS — Z3A12 12 weeks gestation of pregnancy: Secondary | ICD-10-CM

## 2019-09-08 LAB — POCT URINALYSIS DIPSTICK OB
Bilirubin, UA: NEGATIVE
Blood, UA: NEGATIVE
Glucose, UA: NEGATIVE
Ketones, UA: NEGATIVE
Leukocytes, UA: NEGATIVE
Nitrite, UA: NEGATIVE
POC,PROTEIN,UA: NEGATIVE
Spec Grav, UA: 1.01 (ref 1.010–1.025)
Urobilinogen, UA: 0.2 E.U./dL
pH, UA: 6.5 (ref 5.0–8.0)

## 2019-09-08 NOTE — Progress Notes (Signed)
Patient comes in today for new OB physical. She has no concerns today. She declines genetic testing today.

## 2019-09-08 NOTE — Telephone Encounter (Signed)
Pt does not want to see gender results and asked to have a message sent back to notify nurse. Please advise.

## 2019-09-08 NOTE — Progress Notes (Signed)
NOB:   Taking prenatal vitamins as directed.  Will begin aspirin daily.  Anesthesia consult for BMI ordered.  Patient desires MaterniT 19 but does not want to know sex.  Consider AFP for spina bifida next visit.  Recommend early 1 hour GCT next visit.  Physical examination General NAD, Conversant  HEENT Atraumatic; Op clear with mmm.  Normo-cephalic. Pupils reactive. Anicteric sclerae  Thyroid/Neck Smooth without nodularity or enlargement. Normal ROM.  Neck Supple.  Skin No rashes, lesions or ulceration. Normal palpated skin turgor. No nodularity.  Breasts: No masses or discharge.  Symmetric.  No axillary adenopathy.  Lungs: Clear to auscultation.No rales or wheezes. Normal Respiratory effort, no retractions.  Heart: NSR.  No murmurs or rubs appreciated. No periferal edema  Abdomen: Soft.  Non-tender.  No masses.  No HSM. No hernia  Extremities: Moves all appropriately.  Normal ROM for age. No lymphadenopathy.  Neuro: Oriented to PPT.  Normal mood. Normal affect.     Pelvic:   Vulva: Normal appearance.  No lesions.  Vagina: No lesions or abnormalities noted.  Support: Normal pelvic support.  Urethra No masses tenderness or scarring.  Meatus Normal size without lesions or prolapse.  Cervix: Normal appearance.  No lesions.  Anus: Normal exam.  No lesions.  Perineum: Normal exam.  No lesions.        Bimanual   Adnexae: No masses.  Non-tender to palpation.  Uterus: Enlarged. 12wks  Non-tender.  Mobile.  AV.  Adnexae: No masses.  Non-tender to palpation.  Cul-de-sac: Negative for abnormality.  Adnexae: No masses.  Non-tender to palpation.         Pelvimetry   Diagonal: Reached.  Spines: Average.  Sacrum: Concave.  Pubic Arch: Normal.   Exam limited by patient body habitus

## 2019-09-13 NOTE — Telephone Encounter (Signed)
Noted. Tried to call patient to tell her not t check my chart but mail box was full.

## 2019-09-14 LAB — MATERNIT21  PLUS CORE+ESS+SCA, BLOOD

## 2019-09-19 ENCOUNTER — Other Ambulatory Visit: Payer: BC Managed Care – PPO

## 2019-09-22 ENCOUNTER — Other Ambulatory Visit: Payer: Self-pay

## 2019-09-22 ENCOUNTER — Encounter: Payer: Self-pay | Admitting: Obstetrics and Gynecology

## 2019-09-22 ENCOUNTER — Ambulatory Visit (INDEPENDENT_AMBULATORY_CARE_PROVIDER_SITE_OTHER): Payer: BC Managed Care – PPO | Admitting: Obstetrics and Gynecology

## 2019-09-22 VITALS — BP 107/70 | HR 87 | Wt 283.2 lb

## 2019-09-22 DIAGNOSIS — Z3491 Encounter for supervision of normal pregnancy, unspecified, first trimester: Secondary | ICD-10-CM

## 2019-09-22 NOTE — Progress Notes (Signed)
Patient comes in today to check heart tones.

## 2019-09-22 NOTE — Progress Notes (Signed)
Patient presents today simply because she would like to hear fetal heart tones.  She was too early last week.  She has no complaints.  Fetal heart tones heard 156.  Follow-up as scheduled.

## 2019-10-05 ENCOUNTER — Encounter: Payer: Self-pay | Admitting: Obstetrics and Gynecology

## 2019-10-05 ENCOUNTER — Other Ambulatory Visit: Payer: Self-pay

## 2019-10-05 ENCOUNTER — Other Ambulatory Visit: Payer: BC Managed Care – PPO

## 2019-10-05 ENCOUNTER — Ambulatory Visit (INDEPENDENT_AMBULATORY_CARE_PROVIDER_SITE_OTHER): Payer: BC Managed Care – PPO | Admitting: Obstetrics and Gynecology

## 2019-10-05 VITALS — BP 116/75 | HR 90 | Wt 281.4 lb

## 2019-10-05 DIAGNOSIS — Z3A15 15 weeks gestation of pregnancy: Secondary | ICD-10-CM

## 2019-10-05 DIAGNOSIS — O99210 Obesity complicating pregnancy, unspecified trimester: Secondary | ICD-10-CM

## 2019-10-05 DIAGNOSIS — Z23 Encounter for immunization: Secondary | ICD-10-CM

## 2019-10-05 DIAGNOSIS — Z1379 Encounter for other screening for genetic and chromosomal anomalies: Secondary | ICD-10-CM

## 2019-10-05 DIAGNOSIS — Z3482 Encounter for supervision of other normal pregnancy, second trimester: Secondary | ICD-10-CM

## 2019-10-05 DIAGNOSIS — O99212 Obesity complicating pregnancy, second trimester: Secondary | ICD-10-CM

## 2019-10-05 LAB — POCT URINALYSIS DIPSTICK OB
Bilirubin, UA: NEGATIVE
Blood, UA: NEGATIVE
Glucose, UA: NEGATIVE
Ketones, UA: NEGATIVE
Leukocytes, UA: NEGATIVE
Nitrite, UA: NEGATIVE
POC,PROTEIN,UA: NEGATIVE
Spec Grav, UA: 1.015 (ref 1.010–1.025)
Urobilinogen, UA: 0.2 E.U./dL
pH, UA: 6 (ref 5.0–8.0)

## 2019-10-05 MED ORDER — FOLIC ACID 1 MG PO TABS: 1.0000 mg | ORAL_TABLET | Freq: Every day | ORAL | 10 refills | Status: DC

## 2019-10-05 NOTE — Patient Instructions (Addendum)
Second Trimester of Pregnancy The second trimester is from week 14 through week 27 (months 4 through 6). The second trimester is often a time when you feel your best. Your body has adjusted to being pregnant, and you begin to feel better physically. Usually, morning sickness has lessened or quit completely, you may have more energy, and you may have an increase in appetite. The second trimester is also a time when the fetus is growing rapidly. At the end of the sixth month, the fetus is about 9 inches long and weighs about 1 pounds. You will likely begin to feel the baby move (quickening) between 16 and 20 weeks of pregnancy. Body changes during your second trimester Your body continues to go through many changes during your second trimester. The changes vary from woman to woman.  Your weight will continue to increase. You will notice your lower abdomen bulging out.  You may begin to get stretch marks on your hips, abdomen, and breasts.  You may develop headaches that can be relieved by medicines. The medicines should be approved by your health care provider.  You may urinate more often because the fetus is pressing on your bladder.  You may develop or continue to have heartburn as a result of your pregnancy.  You may develop constipation because certain hormones are causing the muscles that push waste through your intestines to slow down.  You may develop hemorrhoids or swollen, bulging veins (varicose veins).  You may have back pain. This is caused by: ? Weight gain. ? Pregnancy hormones that are relaxing the joints in your pelvis. ? A shift in weight and the muscles that support your balance.  Your breasts will continue to grow and they will continue to become tender.  Your gums may bleed and may be sensitive to brushing and flossing.  Dark spots or blotches (chloasma, mask of pregnancy) may develop on your face. This will likely fade after the baby is born.  A dark line from your  belly button to the pubic area (linea nigra) may appear. This will likely fade after the baby is born.  You may have changes in your hair. These can include thickening of your hair, rapid growth, and changes in texture. Some women also have hair loss during or after pregnancy, or hair that feels dry or thin. Your hair will most likely return to normal after your baby is born. What to expect at prenatal visits During a routine prenatal visit:  You will be weighed to make sure you and the fetus are growing normally.  Your blood pressure will be taken.  Your abdomen will be measured to track your baby's growth.  The fetal heartbeat will be listened to.  Any test results from the previous visit will be discussed. Your health care provider may ask you:  How you are feeling.  If you are feeling the baby move.  If you have had any abnormal symptoms, such as leaking fluid, bleeding, severe headaches, or abdominal cramping.  If you are using any tobacco products, including cigarettes, chewing tobacco, and electronic cigarettes.  If you have any questions. Other tests that may be performed during your second trimester include:  Blood tests that check for: ? Low iron levels (anemia). ? High blood sugar that affects pregnant women (gestational diabetes) between 35 and 28 weeks. ? Rh antibodies. This is to check for a protein on red blood cells (Rh factor).  Urine tests to check for infections, diabetes, or protein in the  urine.  An ultrasound to confirm the proper growth and development of the baby.  An amniocentesis to check for possible genetic problems.  Fetal screens for spina bifida and Down syndrome.  HIV (human immunodeficiency virus) testing. Routine prenatal testing includes screening for HIV, unless you choose not to have this test. Follow these instructions at home: Medicines  Follow your health care provider's instructions regarding medicine use. Specific medicines may be  either safe or unsafe to take during pregnancy.  Take a prenatal vitamin that contains at least 600 micrograms (mcg) of folic acid.  If you develop constipation, try taking a stool softener if your health care provider approves. Eating and drinking   Eat a balanced diet that includes fresh fruits and vegetables, whole grains, good sources of protein such as meat, eggs, or tofu, and low-fat dairy. Your health care provider will help you determine the amount of weight gain that is right for you.  Avoid raw meat and uncooked cheese. These carry germs that can cause birth defects in the baby.  If you have low calcium intake from food, talk to your health care provider about whether you should take a daily calcium supplement.  Limit foods that are high in fat and processed sugars, such as fried and sweet foods.  To prevent constipation: ? Drink enough fluid to keep your urine clear or pale yellow. ? Eat foods that are high in fiber, such as fresh fruits and vegetables, whole grains, and beans. Activity  Exercise only as directed by your health care provider. Most women can continue their usual exercise routine during pregnancy. Try to exercise for 30 minutes at least 5 days a week. Stop exercising if you experience uterine contractions.  Avoid heavy lifting, wear low heel shoes, and practice good posture.  A sexual relationship may be continued unless your health care provider directs you otherwise. Relieving pain and discomfort  Wear a good support bra to prevent discomfort from breast tenderness.  Take warm sitz baths to soothe any pain or discomfort caused by hemorrhoids. Use hemorrhoid cream if your health care provider approves.  Rest with your legs elevated if you have leg cramps or low back pain.  If you develop varicose veins, wear support hose. Elevate your feet for 15 minutes, 3-4 times a day. Limit salt in your diet. Prenatal Care  Write down your questions. Take them to  your prenatal visits.  Keep all your prenatal visits as told by your health care provider. This is important. Safety  Wear your seat belt at all times when driving.  Make a list of emergency phone numbers, including numbers for family, friends, the hospital, and police and fire departments. General instructions  Ask your health care provider for a referral to a local prenatal education class. Begin classes no later than the beginning of month 6 of your pregnancy.  Ask for help if you have counseling or nutritional needs during pregnancy. Your health care provider can offer advice or refer you to specialists for help with various needs.  Do not use hot tubs, steam rooms, or saunas.  Do not douche or use tampons or scented sanitary pads.  Do not cross your legs for long periods of time.  Avoid cat litter boxes and soil used by cats. These carry germs that can cause birth defects in the baby and possibly loss of the fetus by miscarriage or stillbirth.  Avoid all smoking, herbs, alcohol, and unprescribed drugs. Chemicals in these products can affect the formation  and growth of the baby.  Do not use any products that contain nicotine or tobacco, such as cigarettes and e-cigarettes. If you need help quitting, ask your health care provider.  Visit your dentist if you have not gone yet during your pregnancy. Use a soft toothbrush to brush your teeth and be gentle when you floss. Contact a health care provider if:  You have dizziness.  You have mild pelvic cramps, pelvic pressure, or nagging pain in the abdominal area.  You have persistent nausea, vomiting, or diarrhea.  You have a bad smelling vaginal discharge.  You have pain when you urinate. Get help right away if:  You have a fever.  You are leaking fluid from your vagina.  You have spotting or bleeding from your vagina.  You have severe abdominal cramping or pain.  You have rapid weight gain or weight loss.  You have  shortness of breath with chest pain.  You notice sudden or extreme swelling of your face, hands, ankles, feet, or legs.  You have not felt your baby move in over an hour.  You have severe headaches that do not go away when you take medicine.  You have vision changes. Summary  The second trimester is from week 14 through week 27 (months 4 through 6). It is also a time when the fetus is growing rapidly.  Your body goes through many changes during pregnancy. The changes vary from woman to woman.  Avoid all smoking, herbs, alcohol, and unprescribed drugs. These chemicals affect the formation and growth your baby.  Do not use any tobacco products, such as cigarettes, chewing tobacco, and e-cigarettes. If you need help quitting, ask your health care provider.  Contact your health care provider if you have any questions. Keep all prenatal visits as told by your health care provider. This is important. This information is not intended to replace advice given to you by your health care provider. Make sure you discuss any questions you have with your health care provider. Document Released: 11/10/2001 Document Revised: 03/10/2019 Document Reviewed: 12/22/2016 Elsevier Patient Education  2020 [employer]    Breastfeeding  Choosing to breastfeed is one of the best decisions you can make for yourself and your baby. A change in hormones during pregnancy causes your breasts to make breast milk in your milk-producing glands. Hormones prevent breast milk from being released before your baby is born. They also prompt milk flow after birth. Once breastfeeding has begun, thoughts of your baby, as well as his or her sucking or crying, can stimulate the release of milk from your milk-producing glands. Benefits of breastfeeding Research shows that breastfeeding offers many health benefits for infants and mothers. It also offers a cost-free and convenient way to feed your baby. For your baby  Your first  milk (colostrum) helps your baby's digestive system to function better.  Special cells in your milk (antibodies) help your baby to fight off infections.  Breastfed babies are less likely to develop asthma, allergies, obesity, or type 2 diabetes. They are also at lower risk for sudden infant death syndrome (SIDS).  Nutrients in breast milk are better able to meet your babys needs compared to infant formula.  Breast milk improves your baby's brain development. For you  Breastfeeding helps to create a very special bond between you and your baby.  Breastfeeding is convenient. Breast milk costs nothing and is always available at the correct temperature.  Breastfeeding helps to burn calories. It helps you to lose the  weight that you gained during pregnancy.  Breastfeeding makes your uterus return faster to its size before pregnancy. It also slows bleeding (lochia) after you give birth.  Breastfeeding helps to lower your risk of developing type 2 diabetes, osteoporosis, rheumatoid arthritis, cardiovascular disease, and breast, ovarian, uterine, and endometrial cancer later in life. Breastfeeding basics Starting breastfeeding  Find a comfortable place to sit or lie down, with your neck and back well-supported.  Place a pillow or a rolled-up blanket under your baby to bring him or her to the level of your breast (if you are seated). Nursing pillows are specially designed to help support your arms and your baby while you breastfeed.  Make sure that your baby's tummy (abdomen) is facing your abdomen.  Gently massage your breast. With your fingertips, massage from the outer edges of your breast inward toward the nipple. This encourages milk flow. If your milk flows slowly, you may need to continue this action during the feeding.  Support your breast with 4 fingers underneath and your thumb above your nipple (make the letter "C" with your hand). Make sure your fingers are well away from your  nipple and your babys mouth.  Stroke your baby's lips gently with your finger or nipple.  When your baby's mouth is open wide enough, quickly bring your baby to your breast, placing your entire nipple and as much of the areola as possible into your baby's mouth. The areola is the colored area around your nipple. ? More areola should be visible above your baby's upper lip than below the lower lip. ? Your baby's lips should be opened and extended outward (flanged) to ensure an adequate, comfortable latch. ? Your baby's tongue should be between his or her lower gum and your breast.  Make sure that your baby's mouth is correctly positioned around your nipple (latched). Your baby's lips should create a seal on your breast and be turned out (everted).  It is common for your baby to suck about 2-3 minutes in order to start the flow of breast milk. Latching Teaching your baby how to latch onto your breast properly is very important. An improper latch can cause nipple pain, decreased milk supply, and poor weight gain in your baby. Also, if your baby is not latched onto your nipple properly, he or she may swallow some air during feeding. This can make your baby fussy. Burping your baby when you switch breasts during the feeding can help to get rid of the air. However, teaching your baby to latch on properly is still the best way to prevent fussiness from swallowing air while breastfeeding. Signs that your baby has successfully latched onto your nipple  Silent tugging or silent sucking, without causing you pain. Infant's lips should be extended outward (flanged).  Swallowing heard between every 3-4 sucks once your milk has started to flow (after your let-down milk reflex occurs).  Muscle movement above and in front of his or her ears while sucking. Signs that your baby has not successfully latched onto your nipple  Sucking sounds or smacking sounds from your baby while breastfeeding.  Nipple pain. If  you think your baby has not latched on correctly, slip your finger into the corner of your babys mouth to break the suction and place it between your baby's gums. Attempt to start breastfeeding again. Signs of successful breastfeeding Signs from your baby  Your baby will gradually decrease the number of sucks or will completely stop sucking.  Your baby will fall  asleep.  Your baby's body will relax.  Your baby will retain a small amount of milk in his or her mouth.  Your baby will let go of your breast by himself or herself. Signs from you  Breasts that have increased in firmness, weight, and size 1-3 hours after feeding.  Breasts that are softer immediately after breastfeeding.  Increased milk volume, as well as a change in milk consistency and color by the fifth day of breastfeeding.  Nipples that are not sore, cracked, or bleeding. Signs that your baby is getting enough milk  Wetting at least 1-2 diapers during the first 24 hours after birth.  Wetting at least 5-6 diapers every 24 hours for the first week after birth. The urine should be clear or pale yellow by the age of 5 days.  Wetting 6-8 diapers every 24 hours as your baby continues to grow and develop.  At least 3 stools in a 24-hour period by the age of 5 days. The stool should be soft and yellow.  At least 3 stools in a 24-hour period by the age of 7 days. The stool should be seedy and yellow.  No loss of weight greater than 10% of birth weight during the first 3 days of life.  Average weight gain of 4-7 oz (113-198 g) per week after the age of 4 days.  Consistent daily weight gain by the age of 5 days, without weight loss after the age of 2 weeks. After a feeding, your baby may spit up a small amount of milk. This is normal. Breastfeeding frequency and duration Frequent feeding will help you make more milk and can prevent sore nipples and extremely full breasts (breast engorgement). Breastfeed when you feel the  need to reduce the fullness of your breasts or when your baby shows signs of hunger. This is called "breastfeeding on demand." Signs that your baby is hungry include:  Increased alertness, activity, or restlessness.  Movement of the head from side to side.  Opening of the mouth when the corner of the mouth or cheek is stroked (rooting).  Increased sucking sounds, smacking lips, cooing, sighing, or squeaking.  Hand-to-mouth movements and sucking on fingers or hands.  Fussing or crying. Avoid introducing a pacifier to your baby in the first 4-6 weeks after your baby is born. After this time, you may choose to use a pacifier. Research has shown that pacifier use during the first year of a baby's life decreases the risk of sudden infant death syndrome (SIDS). Allow your baby to feed on each breast as long as he or she wants. When your baby unlatches or falls asleep while feeding from the first breast, offer the second breast. Because newborns are often sleepy in the first few weeks of life, you may need to awaken your baby to get him or her to feed. Breastfeeding times will vary from baby to baby. However, the following rules can serve as a guide to help you make sure that your baby is properly fed:  Newborns (babies 51 weeks of age or younger) may breastfeed every 1-3 hours.  Newborns should not go without breastfeeding for longer than 3 hours during the day or 5 hours during the night.  You should breastfeed your baby a minimum of 8 times in a 24-hour period. Breast milk pumping     Pumping and storing breast milk allows you to make sure that your baby is exclusively fed your breast milk, even at times when you are unable  to breastfeed. This is especially important if you go back to work while you are still breastfeeding, or if you are not able to be present during feedings. Your lactation consultant can help you find a method of pumping that works best for you and give you guidelines about  how long it is safe to store breast milk. °Caring for your breasts while you breastfeed °Nipples can become dry, cracked, and sore while breastfeeding. The following recommendations can help keep your breasts moisturized and healthy: °· Avoid using soap on your nipples. °· Wear a supportive bra designed especially for nursing. Avoid wearing underwire-style bras or extremely tight bras (sports bras). °· Air-dry your nipples for 3-4 minutes after each feeding. °· Use only cotton bra pads to absorb leaked breast milk. Leaking of breast milk between feedings is normal. °· Use lanolin on your nipples after breastfeeding. Lanolin helps to maintain your skin's normal moisture barrier. Pure lanolin is not harmful (not toxic) to your baby. You may also hand express a few drops of breast milk and gently massage that milk into your nipples and allow the milk to air-dry. °In the first few weeks after giving birth, some women experience breast engorgement. Engorgement can make your breasts feel heavy, warm, and tender to the touch. Engorgement peaks within 3-5 days after you give birth. The following recommendations can help to ease engorgement: °· Completely empty your breasts while breastfeeding or pumping. You may want to start by applying warm, moist heat (in the shower or with warm, water-soaked hand towels) just before feeding or pumping. This increases circulation and helps the milk flow. If your baby does not completely empty your breasts while breastfeeding, pump any extra milk after he or she is finished. °· Apply ice packs to your breasts immediately after breastfeeding or pumping, unless this is too uncomfortable for you. To do this: °? Put ice in a plastic bag. °? Place a towel between your skin and the bag. °? Leave the ice on for 20 minutes, 2-3 times a day. °· Make sure that your baby is latched on and positioned properly while breastfeeding. °If engorgement persists after 48 hours of following these  recommendations, contact your health care provider or a lactation consultant. °Overall health care recommendations while breastfeeding °· Eat 3 healthy meals and 3 snacks every day. Well-nourished mothers who are breastfeeding need an additional 450-500 calories a day. You can meet this requirement by increasing the amount of a balanced diet that you eat. °· Drink enough water to keep your urine pale yellow or clear. °· Rest often, relax, and continue to take your prenatal vitamins to prevent fatigue, stress, and low vitamin and mineral levels in your body (nutrient deficiencies). °· Do not use any products that contain nicotine or tobacco, such as cigarettes and e-cigarettes. Your baby may be harmed by chemicals from cigarettes that pass into breast milk and exposure to secondhand smoke. If you need help quitting, ask your health care provider. °· Avoid alcohol. °· Do not use illegal drugs or marijuana. °· Talk with your health care provider before taking any medicines. These include over-the-counter and prescription medicines as well as vitamins and herbal supplements. Some medicines that may be harmful to your baby can pass through breast milk. °· It is possible to become pregnant while breastfeeding. If birth control is desired, ask your health care provider about options that will be safe while breastfeeding your baby. °Where to find more information: °[employer]: www.llli.org °Contact a   health care provider if:  You feel like you want to stop breastfeeding or have become frustrated with breastfeeding.  Your nipples are cracked or bleeding.  Your breasts are red, tender, or warm.  You have: ? Painful breasts or nipples. ? A swollen area on either breast. ? A fever or chills. ? Nausea or vomiting. ? Drainage other than breast milk from your nipples.  Your breasts do not become full before feedings by the fifth day after you give birth.  You feel sad and depressed.  Your baby  is: ? Too sleepy to eat well. ? Having trouble sleeping. ? More than 106 week old and wetting fewer than 6 diapers in a 24-hour period. ? Not gaining weight by 17 days of age.  Your baby has fewer than 3 stools in a 24-hour period.  Your baby's skin or the white parts of his or her eyes become yellow. Get help right away if:  Your baby is overly tired (lethargic) and does not want to wake up and feed.  Your baby develops an unexplained fever. Summary  Breastfeeding offers many health benefits for infant and mothers.  Try to breastfeed your infant when he or she shows early signs of hunger.  Gently tickle or stroke your baby's lips with your finger or nipple to allow the baby to open his or her mouth. Bring the baby to your breast. Make sure that much of the areola is in your baby's mouth. Offer one side and burp the baby before you offer the other side.  Talk with your health care provider or lactation consultant if you have questions or you face problems as you breastfeed. This information is not intended to replace advice given to you by your health care provider. Make sure you discuss any questions you have with your health care provider. Document Released: 11/16/2005 Document Revised: 02/10/2018 Document Reviewed: 12/18/2016 Elsevier Patient Education  2020 [employer].

## 2019-10-05 NOTE — Progress Notes (Signed)
ROB-Pt present for routine prenatal care. Pt stated having stomach pains and nausea. Flu vaccine administration today.

## 2019-10-05 NOTE — Progress Notes (Signed)
ROB: Patient complains of occasional stomach pains (mild) and nausea. Otherwise doing well. For flu shot today. Has Anesthesia consultation scheduled. Discussed need for additional folic acid due to BMI. Normal [id number], does not desire to know gender for entire pregnancy. For AFP today. Discussed Ready Set Baby initiative, breastfeeding benefits and delayed cord clamping discussed today, given booklet. RTC in 4 weeks, for anatomy scan. RTC within 1 week for glucola.

## 2019-10-06 LAB — GLUCOSE, 1 HOUR GESTATIONAL: Gestational Diabetes Screen: 101 mg/dL (ref 65–139)

## 2019-10-10 ENCOUNTER — Other Ambulatory Visit: Admission: RE | Admit: 2019-10-10 | Payer: BC Managed Care – PPO | Source: Ambulatory Visit

## 2019-10-13 ENCOUNTER — Inpatient Hospital Stay: Admission: RE | Admit: 2019-10-13 | Payer: BC Managed Care – PPO | Source: Ambulatory Visit

## 2019-11-06 ENCOUNTER — Telehealth: Payer: Self-pay

## 2019-11-06 NOTE — Telephone Encounter (Signed)
Patient has UTI symptoms would like a call from the nurse or doctor.

## 2019-11-07 NOTE — Telephone Encounter (Signed)
LM for patient to return call.

## 2019-11-08 NOTE — Telephone Encounter (Signed)
LM for patient to return call.

## 2019-11-09 ENCOUNTER — Other Ambulatory Visit: Payer: Self-pay

## 2019-11-09 ENCOUNTER — Ambulatory Visit (INDEPENDENT_AMBULATORY_CARE_PROVIDER_SITE_OTHER): Payer: BC Managed Care – PPO | Admitting: Obstetrics and Gynecology

## 2019-11-09 ENCOUNTER — Ambulatory Visit (INDEPENDENT_AMBULATORY_CARE_PROVIDER_SITE_OTHER): Payer: BC Managed Care – PPO

## 2019-11-09 ENCOUNTER — Encounter: Payer: Self-pay | Admitting: Obstetrics and Gynecology

## 2019-11-09 VITALS — BP 108/71 | HR 95 | Wt 285.7 lb

## 2019-11-09 DIAGNOSIS — Z3482 Encounter for supervision of other normal pregnancy, second trimester: Secondary | ICD-10-CM

## 2019-11-09 DIAGNOSIS — Z363 Encounter for antenatal screening for malformations: Secondary | ICD-10-CM

## 2019-11-09 DIAGNOSIS — O99210 Obesity complicating pregnancy, unspecified trimester: Secondary | ICD-10-CM

## 2019-11-09 NOTE — Progress Notes (Signed)
ROB: Patient feels much better.  Daily fetal movement.  FAS ultrasound today-normal anatomy noted.  Patient missed her anesthesia appointment for increased BMI but has rescheduled this.  Importance of this visit stressed.

## 2019-11-09 NOTE — Progress Notes (Signed)
Patient comes in today for ROB visit. No concerns today. 

## 2019-11-09 NOTE — Telephone Encounter (Signed)
Patient came in for appointment.  

## 2019-11-28 ENCOUNTER — Other Ambulatory Visit: Payer: Self-pay

## 2019-11-28 ENCOUNTER — Encounter
Admission: RE | Admit: 2019-11-28 | Discharge: 2019-11-28 | Disposition: A | Discharge status: Home / Self Care | Payer: BC Managed Care – PPO | Source: Ambulatory Visit | Attending: Anesthesiology | Admitting: Anesthesiology

## 2019-11-28 NOTE — Consult Note (Signed)
[facility] Anesthesia Consultation  Anna Baldwin HBZ:[id number] DOB: 14-Nov-1986 DOA: 11/28/2019 PCP: Marina Goodell, MD   Requesting physician: Dr. Logan Bores Date of consultation: 11/28/19 Reason for consultation: Obesity during pregnancy  CHIEF COMPLAINT:  Obesity during pregnancy  HISTORY OF PRESENT ILLNESS: Anna Baldwin  is a 33 y.o. female with a known history of obesity during pregnancy. This is her third baby, had 2 prior vaginal deliveries both with successful epidural labor analgesia. Denies hx of cardiovascular disease. Denies hx of asthma. Denies personal or family hx of bleeding disorders.   PAST MEDICAL HISTORY:   Past Medical History:  Diagnosis Date  . Family history of breast cancer   . Family history of ovarian cancer     PAST SURGICAL HISTORY: No past surgical history on file.  SOCIAL HISTORY:  Social History   Tobacco Use  . Smoking status: Never Smoker  . Smokeless tobacco: Never Used  Substance Use Topics  . Alcohol use: Not Currently    FAMILY HISTORY:  Family History  Problem Relation Age of Onset  . Breast cancer Maternal Aunt 30  . Ovarian cancer Maternal Grandmother 32  . Breast cancer Maternal Aunt 30  . Breast cancer Maternal Aunt 30    DRUG ALLERGIES: No Known Allergies  REVIEW OF SYSTEMS:   RESPIRATORY: No cough, shortness of breath, wheezing.  CARDIOVASCULAR: No chest pain, orthopnea, edema.  HEMATOLOGY: No anemia, easy bruising or bleeding SKIN: No rash or lesion. NEUROLOGIC: No tingling, numbness, weakness.  PSYCHIATRY: No anxiety or depression.   MEDICATIONS AT HOME:  Prior to Admission medications   Medication Sig Start Date End Date Taking? Authorizing Provider  Ascorbic Acid (VITAMIN C) 1000 MG tablet Take 1,000 mg by mouth daily. 09/13/18   [provider]  folic acid (FOLVITE) 1 MG tablet Take 1 tablet (1 mg total) by mouth daily. 10/05/19   Hildred Laser, MD  OVER THE  COUNTER MEDICATION daily. Calm Magnesium take one tablet daily at bedtime to help with sleep.    [provider]  Prenatal Vit-Fe Fumarate-FA (PRENATAL MULTIVITAMIN) TABS tablet Take 1 tablet by mouth daily at 12 noon.    [provider]      PHYSICAL EXAMINATION:   VITAL SIGNS: Last menstrual period 06/19/2019.  GENERAL:  33 y.o.-year-old patient no acute distress.  HEENT: Head atraumatic, normocephalic. Oropharynx and nasopharynx clear. MP 1, TM distance >3 cm, normal mouth opening, grade 1 upper lip bite. LUNGS: Normal breath sounds bilaterally, no wheezing, rales,rhonchi. No use of accessory muscles of respiration.  CARDIOVASCULAR: S1, S2 normal. No murmurs, rubs, or gallops.  EXTREMITIES: No pedal edema, cyanosis, or clubbing.  NEUROLOGIC: normal gait PSYCHIATRIC: The patient is alert and oriented x 3.  SKIN: No obvious rash, lesion, or ulcer.    IMPRESSION AND PLAN:   Anna Baldwin  is a 33 y.o. female presenting with obesity during pregnancy. BMI is currently 50 at [redacted] weeks gestation.   Reassuring airway exam. Spinal interspaces minimally palpable.   We discussed analgesic options during labor including epidural analgesia. Discussed that in obesity there can be increased difficulty with epidural placement or even failure of successful epidural. We also discussed that even after successful epidural placement there is increased risk of catheter migration out of the epidural space that would require catheter replacement. Discussed use of epidural vs spinal vs GA if cesarean delivery is required. Discussed increased risk of difficult intubation during pregnancy should an emergency cesarean delivery be required.   We  discussed repeat evaluation at 35-36 weeks by anesthesia to determine whether there is a high risk of complications of anesthesia for which we would recommend transfer of OB care to a facility with a higher maternal level of care designation.

## 2019-12-01 NOTE — L&D Delivery Note (Signed)
Delivery Summary for Anna Baldwin  Labor Events:   Preterm labor: No data found  Rupture date: 03/22/2020  Rupture time: 10:58 AM  Rupture type: Spontaneous Intact  Fluid Color: Clear White  Induction: No data found  Augmentation: No data found  Complications: No data found  Cervical ripening: No data found No data found   No data found     Delivery:   Episiotomy: No data found  Lacerations: No data found  Repair suture: No data found  Repair # of packets: No data found  Blood loss (ml): 300   Information for the patient's newborn:  Royal, Beirne [[id number]]    Delivery 03/22/2020 12:46 PM by  Vaginal, Spontaneous Sex:  female Gestational Age: [redacted]w[redacted]d Delivery Clinician:   Living?:         APGARS  One minute Five minutes Ten minutes  Skin color:        Heart rate:        Grimace:        Muscle tone:        Breathing:        Totals: 6  9      Presentation/position:      Resuscitation:   Cord information:    Disposition of cord blood:     Blood gases sent?  Complications:   Placenta: Delivered:       appearance Newborn Measurements: Weight: 7 lb 4.1 oz (3290 g)  Height: 20.28"  Head circumference:    Chest circumference:    Other providers:    Additional  information: Forceps:   Vacuum:   Breech:   Observed anomalies       Delivery Note At 12:46 PM a viable and healthy female was delivered precipitously via Vaginal, Spontaneous (Presentation: Middle Occiput Anterior).  APGAR: 6, 9; weight 3290 grams.   Placenta status: Spontaneous, Intact.  Cord: 3 vessels with the following complications: None.  Cord pH: not obtained.  Delayed cod clamping observed.   Anesthesia: Epidural Episiotomy: None Lacerations: None Suture Repair: None Est. Blood Loss (mL): 300  Mom to postpartum.  Baby to Couplet care / Skin to Skin.  Hildred Laser, MD 03/22/2020, 1:16 PM

## 2019-12-07 ENCOUNTER — Other Ambulatory Visit: Payer: Self-pay

## 2019-12-07 ENCOUNTER — Ambulatory Visit (INDEPENDENT_AMBULATORY_CARE_PROVIDER_SITE_OTHER): Payer: BC Managed Care – PPO | Admitting: Obstetrics and Gynecology

## 2019-12-07 ENCOUNTER — Encounter: Payer: Self-pay | Admitting: Obstetrics and Gynecology

## 2019-12-07 VITALS — BP 108/74 | HR 103 | Wt 286.9 lb

## 2019-12-07 DIAGNOSIS — N949 Unspecified condition associated with female genital organs and menstrual cycle: Secondary | ICD-10-CM

## 2019-12-07 DIAGNOSIS — Z3482 Encounter for supervision of other normal pregnancy, second trimester: Secondary | ICD-10-CM

## 2019-12-07 DIAGNOSIS — Z130 Encounter for screening for diseases of the blood and blood-forming organs and certain disorders involving the immune mechanism: Secondary | ICD-10-CM

## 2019-12-07 DIAGNOSIS — Z131 Encounter for screening for diabetes mellitus: Secondary | ICD-10-CM

## 2019-12-07 LAB — POCT URINALYSIS DIPSTICK OB
Bilirubin, UA: NEGATIVE
Blood, UA: NEGATIVE
Glucose, UA: NEGATIVE
Leukocytes, UA: NEGATIVE
Nitrite, UA: NEGATIVE
Spec Grav, UA: 1.01 (ref 1.010–1.025)
Urobilinogen, UA: 0.2 E.U./dL
pH, UA: 7 (ref 5.0–8.0)

## 2019-12-07 NOTE — Progress Notes (Signed)
ROB-Pt present for routine prenatal care. Pt stated having right side pelvic pain.

## 2019-12-07 NOTE — Progress Notes (Signed)
ROB: Patient notes round ligament pain on the right. Otherwise doing well.  S/p first anesthesia consult, repeat due at 34-35 weeks. RTC in 4 weeks, for 1 hr glucola.

## 2019-12-08 ENCOUNTER — Telehealth: Payer: Self-pay | Admitting: Obstetrics and Gynecology

## 2019-12-11 NOTE — Telephone Encounter (Signed)
error 

## 2019-12-18 ENCOUNTER — Telehealth: Payer: Self-pay | Admitting: Obstetrics and Gynecology

## 2019-12-18 NOTE — Telephone Encounter (Signed)
Pt is requesting a call about her test results. Please advise.

## 2019-12-18 NOTE — Telephone Encounter (Signed)
Spoke with patient about results. Please see other message.

## 2020-01-04 ENCOUNTER — Other Ambulatory Visit: Payer: Self-pay

## 2020-01-04 ENCOUNTER — Ambulatory Visit (INDEPENDENT_AMBULATORY_CARE_PROVIDER_SITE_OTHER): Payer: BC Managed Care – PPO | Admitting: Obstetrics and Gynecology

## 2020-01-04 ENCOUNTER — Encounter: Payer: Self-pay | Admitting: Obstetrics and Gynecology

## 2020-01-04 VITALS — BP 104/72 | HR 87 | Wt 289.0 lb

## 2020-01-04 DIAGNOSIS — Z113 Encounter for screening for infections with a predominantly sexual mode of transmission: Secondary | ICD-10-CM

## 2020-01-04 DIAGNOSIS — Z3482 Encounter for supervision of other normal pregnancy, second trimester: Secondary | ICD-10-CM | POA: Diagnosis not present

## 2020-01-04 DIAGNOSIS — Z131 Encounter for screening for diabetes mellitus: Secondary | ICD-10-CM

## 2020-01-04 DIAGNOSIS — Z23 Encounter for immunization: Secondary | ICD-10-CM

## 2020-01-04 DIAGNOSIS — Z130 Encounter for screening for diseases of the blood and blood-forming organs and certain disorders involving the immune mechanism: Secondary | ICD-10-CM

## 2020-01-04 LAB — POCT URINALYSIS DIPSTICK OB
Bilirubin, UA: NEGATIVE
Blood, UA: NEGATIVE
Glucose, UA: NEGATIVE
Leukocytes, UA: NEGATIVE
Nitrite, UA: NEGATIVE
POC,PROTEIN,UA: NEGATIVE
Spec Grav, UA: 1.01 (ref 1.010–1.025)
Urobilinogen, UA: 0.2 E.U./dL
pH, UA: 6.5 (ref 5.0–8.0)

## 2020-01-04 NOTE — Progress Notes (Signed)
ROB: No complaints.  Occasional Braxton Hicks contractions.  1 hour GCT today.

## 2020-01-04 NOTE — Addendum Note (Signed)
Addended by: Dorian Pod on: 01/04/2020 02:59 PM   Modules accepted: Orders

## 2020-01-04 NOTE — Addendum Note (Signed)
Addended by: Dorian Pod on: 01/04/2020 02:40 PM   Modules accepted: Orders

## 2020-01-05 LAB — CBC
Hematocrit: 33 % — ABNORMAL LOW (ref 34.0–46.6)
Hemoglobin: 10.8 g/dL — ABNORMAL LOW (ref 11.1–15.9)
MCH: 25.8 pg — ABNORMAL LOW (ref 26.6–33.0)
MCHC: 32.7 g/dL (ref 31.5–35.7)
MCV: 79 fL (ref 79–97)
Platelets: 252 10*3/uL (ref 150–450)
RBC: 4.19 x10E6/uL (ref 3.77–5.28)
RDW: 15 % (ref 11.7–15.4)
WBC: 11.1 10*3/uL — ABNORMAL HIGH (ref 3.4–10.8)

## 2020-01-05 LAB — RPR: RPR Ser Ql: NONREACTIVE

## 2020-01-05 LAB — GLUCOSE, 1 HOUR GESTATIONAL: Gestational Diabetes Screen: 119 mg/dL (ref 65–139)

## 2020-01-08 DIAGNOSIS — Z0289 Encounter for other administrative examinations: Secondary | ICD-10-CM

## 2020-01-09 ENCOUNTER — Telehealth: Payer: Self-pay

## 2020-01-09 NOTE — Telephone Encounter (Signed)
Pt called no answer LM via VM to call the office for dates of leave for FMLA forms. Pt is aware that the dates are needed to complete her forms.

## 2020-01-10 ENCOUNTER — Telehealth: Payer: Self-pay

## 2020-01-10 NOTE — Telephone Encounter (Signed)
Patient cannot make any appt til 4:30. Is upset that Dr. Logan Bores stops taking patients at 3:30/4, wants to talk about seeing [facility] for each OB visit.

## 2020-01-10 NOTE — Telephone Encounter (Signed)
Fax number for FMLA [phone]

## 2020-01-15 NOTE — Telephone Encounter (Signed)
Spoke with pt concerning FMLA forms and JW spoke with pt concerning the changes made to [facility] scheduled and pt is aware that arrangements will be made to assist with her scheduling an appointment with [facility].

## 2020-01-17 ENCOUNTER — Telehealth: Payer: Self-pay

## 2020-01-17 NOTE — Telephone Encounter (Signed)
Pt called no answer LM via VM that her FMLA forms have been faxed.

## 2020-01-21 ENCOUNTER — Other Ambulatory Visit: Payer: Self-pay

## 2020-01-21 ENCOUNTER — Encounter: Payer: Self-pay | Admitting: Obstetrics and Gynecology

## 2020-01-21 ENCOUNTER — Observation Stay
Admission: EM | Admit: 2020-01-21 | Discharge: 2020-01-21 | Disposition: A | Discharge status: Home / Self Care | Payer: BC Managed Care – PPO | Attending: Obstetrics and Gynecology | Admitting: Obstetrics and Gynecology

## 2020-01-21 DIAGNOSIS — Z3A31 31 weeks gestation of pregnancy: Secondary | ICD-10-CM | POA: Diagnosis not present

## 2020-01-21 DIAGNOSIS — Z3483 Encounter for supervision of other normal pregnancy, third trimester: Secondary | ICD-10-CM | POA: Diagnosis not present

## 2020-01-21 DIAGNOSIS — O4193X0 Disorder of amniotic fluid and membranes, unspecified, third trimester, not applicable or unspecified: Secondary | ICD-10-CM | POA: Diagnosis not present

## 2020-01-21 DIAGNOSIS — O26893 Other specified pregnancy related conditions, third trimester: Secondary | ICD-10-CM | POA: Diagnosis present

## 2020-01-21 HISTORY — DX: Other specified health status: Z78.9

## 2020-01-21 LAB — URINALYSIS, ROUTINE W REFLEX MICROSCOPIC
Bilirubin Urine: NEGATIVE
Glucose, UA: NEGATIVE mg/dL
Hgb urine dipstick: NEGATIVE
Ketones, ur: NEGATIVE mg/dL
Leukocytes,Ua: NEGATIVE
Nitrite: NEGATIVE
Protein, ur: NEGATIVE mg/dL
Specific Gravity, Urine: 1.01 (ref 1.005–1.030)
pH: 7 (ref 5.0–8.0)

## 2020-01-21 LAB — RUPTURE OF MEMBRANE (ROM)PLUS: Rom Plus: NEGATIVE

## 2020-01-21 NOTE — OB Triage Note (Signed)
Possible SROM around 0600 this am. Not having to wear a pad. Pt reports no additional ctx noted since SROM. Fetal movement noted, audible. Pt denies feeling fetal movement. Anna Baldwin

## 2020-01-21 NOTE — Progress Notes (Signed)
Discharge home. Follow-up appointment already scheduled for Wednesday, Feb 24th with Dr. Valentino Saxon. Left floor ambulatory with husband. Elaina Hoops

## 2020-01-23 NOTE — Discharge Summary (Signed)
    L&D OB Triage Note  SUBJECTIVE Anna Baldwin is a 34 y.o. [id number] female at [redacted]w[redacted]d, EDD Estimated Date of Delivery: 03/25/20 who presented to triage with complaints of leakage of fluid.  Denies vaginal bleeding.  Complains of increasing contractions.  OB History  Gravida Para Term Preterm AB Living  4 2 2  0 1 2  SAB TAB Ectopic Multiple Live Births  1 0 0 0 2    # Outcome Date GA Lbr Len/2nd Weight Sex Delivery Anes PTL Lv  4 Current           3 SAB 06/2018          2 Term 10/26/12 [redacted]w[redacted]d  3062 g M Vag-Spont   LIV  1 Term 10/10/07 [redacted]w[redacted]d  3345 g F Vag-Spont   LIV    No medications prior to admission.     OBJECTIVE  Nursing Evaluation:   BP (!) 96/57   Pulse 90   Temp 98.4 F (36.9 C) (Oral)   Ht 5\' 3"  (1.6 m)   Wt 135.2 kg   LMP 06/19/2019   BMI 52.79 kg/m    Findings:        ROM plus NEGATIVE     UA no evidence of UTI      NST was performed and has been reviewed by me.  NST INTERPRETATION: Category I  Mode: External, Intermittent Baseline Rate (A): 135 bpm Variability: Moderate Accelerations: None Decelerations: None     Contraction Frequency (min): none  ASSESSMENT Impression:  1.  Pregnancy:  at [redacted]w[redacted]d , EDD Estimated Date of Delivery: 03/25/20 2.  Reassuring fetal and maternal status  PLAN 1. Discussed current condition and above findings with patient and reassurance given.  All questions answered. 2. Discharge home with standard labor precautions given to return to L&D or call the office for problems. 3. Continue routine prenatal care.

## 2020-01-24 ENCOUNTER — Other Ambulatory Visit: Payer: Self-pay

## 2020-01-24 ENCOUNTER — Ambulatory Visit (INDEPENDENT_AMBULATORY_CARE_PROVIDER_SITE_OTHER): Payer: BC Managed Care – PPO | Admitting: Obstetrics and Gynecology

## 2020-01-24 ENCOUNTER — Encounter: Payer: Self-pay | Admitting: Obstetrics and Gynecology

## 2020-01-24 VITALS — BP 102/69 | HR 85 | Wt 291.2 lb

## 2020-01-24 DIAGNOSIS — O0993 Supervision of high risk pregnancy, unspecified, third trimester: Secondary | ICD-10-CM

## 2020-01-24 DIAGNOSIS — O99213 Obesity complicating pregnancy, third trimester: Secondary | ICD-10-CM

## 2020-01-24 DIAGNOSIS — O99013 Anemia complicating pregnancy, third trimester: Secondary | ICD-10-CM

## 2020-01-24 LAB — POCT URINALYSIS DIPSTICK OB
Bilirubin, UA: NEGATIVE
Blood, UA: NEGATIVE
Glucose, UA: NEGATIVE
Leukocytes, UA: NEGATIVE
Nitrite, UA: NEGATIVE
POC,PROTEIN,UA: NEGATIVE
Spec Grav, UA: 1.02 (ref 1.010–1.025)
Urobilinogen, UA: 0.2 E.U./dL
pH, UA: 6 (ref 5.0–8.0)

## 2020-01-24 MED ORDER — FERROUS SULFATE 325 (65 FE) MG PO TABS: 325.0000 mg | ORAL_TABLET | Freq: Every day | ORAL | 1 refills | Status: DC

## 2020-01-24 NOTE — Addendum Note (Signed)
Addended by: Fabian November on: 01/24/2020 05:00 PM   Modules accepted: Orders

## 2020-01-24 NOTE — Progress Notes (Signed)
ROB-Pt present for routine prenatal care. Pt c/o of lower abd pain and braxton hick contractions.

## 2020-01-24 NOTE — Progress Notes (Signed)
ROB: patient notes fatigue. Also noting [facility], pelvic pain. Trying to drink more water. Advised on pelvic girdle. Also notes craving corn starch lately.  Iron levels show patient is mildly anemic. Given iron supplements.  Desires to breastfeed, desires unsure method for contraception, may consider copper IUD. For Tdap today, signed blood consent, discussed cord blood banking. Discussed BMI, need for antenatal testing at 36 weeks, need for IOL by 40 weeks.  Need for growth scan next visit and again at 37 weeks.

## 2020-01-26 ENCOUNTER — Encounter: Payer: BC Managed Care – PPO | Admitting: Obstetrics and Gynecology

## 2020-02-07 ENCOUNTER — Ambulatory Visit (INDEPENDENT_AMBULATORY_CARE_PROVIDER_SITE_OTHER): Payer: BC Managed Care – PPO

## 2020-02-07 ENCOUNTER — Other Ambulatory Visit: Payer: Self-pay

## 2020-02-07 ENCOUNTER — Encounter: Payer: Self-pay | Admitting: Obstetrics and Gynecology

## 2020-02-07 ENCOUNTER — Ambulatory Visit (INDEPENDENT_AMBULATORY_CARE_PROVIDER_SITE_OTHER): Payer: BC Managed Care – PPO | Admitting: Obstetrics and Gynecology

## 2020-02-07 VITALS — BP 110/78 | HR 80 | Wt 291.2 lb

## 2020-02-07 DIAGNOSIS — Z3A33 33 weeks gestation of pregnancy: Secondary | ICD-10-CM

## 2020-02-07 DIAGNOSIS — O09893 Supervision of other high risk pregnancies, third trimester: Secondary | ICD-10-CM

## 2020-02-07 DIAGNOSIS — O99213 Obesity complicating pregnancy, third trimester: Secondary | ICD-10-CM

## 2020-02-07 DIAGNOSIS — O289 Unspecified abnormal findings on antenatal screening of mother: Secondary | ICD-10-CM | POA: Insufficient documentation

## 2020-02-07 DIAGNOSIS — O0993 Supervision of high risk pregnancy, unspecified, third trimester: Secondary | ICD-10-CM

## 2020-02-07 DIAGNOSIS — O409XX0 Polyhydramnios, unspecified trimester, not applicable or unspecified: Secondary | ICD-10-CM | POA: Insufficient documentation

## 2020-02-07 DIAGNOSIS — Z3483 Encounter for supervision of other normal pregnancy, third trimester: Secondary | ICD-10-CM

## 2020-02-07 DIAGNOSIS — Z362 Encounter for other antenatal screening follow-up: Secondary | ICD-10-CM

## 2020-02-07 LAB — POCT URINALYSIS DIPSTICK OB
Bilirubin, UA: NEGATIVE
Blood, UA: NEGATIVE
Glucose, UA: NEGATIVE
Ketones, UA: NEGATIVE
Nitrite, UA: NEGATIVE
Spec Grav, UA: 1.015 (ref 1.010–1.025)
Urobilinogen, UA: 0.2 E.U./dL
pH, UA: 6 (ref 5.0–8.0)

## 2020-02-07 NOTE — Progress Notes (Signed)
ROB: Patient doing well, no complaints. Growth scan done today, normal growth (46%ile) but borderline increased AFI noted (20.3 cm).  Will recheck AFI again at 37 weeks with next growth scan.  Desires delayed cord clamping until cord pulsation ceases. Needs f/u anesthesia appointment for increased BMI. Discussed breastfeeding information, see below. Will submit breast pump form to insurance. RTC in 2 weeks.    The following were addressed during this visit:  Breastfeeding Education - Early initiation of breastfeeding    Comments: Keeps milk supply adequate, helps contract uterus and slow bleeding, and early milk is the perfect first food and is easy to digest.   - The importance of exclusive breastfeeding    Comments: Provides antibodies, Lower risk of breast and ovarian cancers, and type-2 diabetes,Helps your body recover, Reduced chance of SIDS.   - Risks of giving your baby anything other than breast milk if you are breastfeeding    Comments: Make the baby less content with breastfeeds, may make my baby more susceptible to illness, and may reduce my milk supply.   - Nonpharmacological pain relief methods for labor    Comments: Deep breathing, focusing on pleasant things, movement and walking, heating pads or cold compress, massage and relaxation, continuous support from someone you trust, and Doulas   - The importance of early skin-to-skin contact    Comments: Keeps baby warm and secure, helps keep baby's blood sugar up and breathing steady, easier to bond and breastfeed, and helps calm baby.  - Rooming-in on a 24-hour basis    Comments: Easier to learn baby's feeding cues, easier to bond and get to know each other, and encourages milk production.   - Feeding on demand or baby-led feeding    Comments: Helps prevent breastfeeding complications, helps bring in good milk supply, prevents under or overfeeding, and helps baby feel content and satisfied   - Frequent feeding to help  assure optimal milk production    Comments: Making a full supply of milk requires frequent removal of milk from breasts, infant will eat 8-12 times in 24 hours, if separated from infant use breast massage, hand expression and/ or pumping to remove milk from breasts.   - Effective positioning and attachment    Comments: Helps my baby to get enough breast milk, helps to produce an adequate milk supply, and helps prevent nipple pain and damage   - Exclusive breastfeeding for the first 6 months    Comments: Builds a healthy milk supply and keeps it up, protects baby from sickness and disease, and breastmilk has everything your baby needs for the first 6 months.  - Individualized Education    Comments: Contraindications to breastfeeding and other special medical conditions

## 2020-02-07 NOTE — Progress Notes (Signed)
ROB-Pt present for routine prenatal care. Pt stated having lower abd pressure no other problems.

## 2020-02-16 ENCOUNTER — Other Ambulatory Visit: Payer: Self-pay

## 2020-02-16 ENCOUNTER — Encounter
Admission: RE | Admit: 2020-02-16 | Discharge: 2020-02-16 | Disposition: A | Discharge status: Home / Self Care | Payer: BC Managed Care – PPO | Source: Ambulatory Visit | Attending: Anesthesiology | Admitting: Anesthesiology

## 2020-02-16 NOTE — Consult Note (Signed)
[facility] Anesthesia Consultation  Yaniris Braddock BTD:[id number] DOB: [birth year] DOA: 02/16/2020 PCP: Marina Goodell, MD   Requesting physician: Dr. Valentino Saxon Date of consultation: 02/16/20 Reason for consultation: Obesity during pregnancy  CHIEF COMPLAINT:  Obesity during pregnancy  HISTORY OF PRESENT ILLNESS: Faige Seely  is a 34 y.o. female with a known history of obesity during pregnancy. Currently at [redacted]w[redacted]d, planning for vaginal delivery, hx of 2 prior vaginal deliveries. Reports only 6lb weight gain during this pregnancy. No issues with high BP or diabetes.   PAST MEDICAL HISTORY:   Past Medical History:  Diagnosis Date  . Family history of breast cancer   . Family history of ovarian cancer   . Medical history non-contributory     PAST SURGICAL HISTORY:  Past Surgical History:  Procedure Laterality Date  . NO PAST SURGERIES      SOCIAL HISTORY:  Social History   Tobacco Use  . Smoking status: Never Smoker  . Smokeless tobacco: Never Used  Substance Use Topics  . Alcohol use: Not Currently    FAMILY HISTORY:  Family History  Problem Relation Age of Onset  . Breast cancer Maternal Aunt 30  . Ovarian cancer Maternal Grandmother 48  . Breast cancer Maternal Aunt 30  . Breast cancer Maternal Aunt 30  . Hyperlipidemia Mother   . Diabetes Father     DRUG ALLERGIES: No Known Allergies  REVIEW OF SYSTEMS:   RESPIRATORY: No cough, shortness of breath, wheezing.  CARDIOVASCULAR: No chest pain, orthopnea, edema.  HEMATOLOGY: No anemia, easy bruising or bleeding SKIN: No rash or lesion. NEUROLOGIC: No tingling, numbness, weakness.  PSYCHIATRY: No anxiety or depression.   MEDICATIONS AT HOME:  Prior to Admission medications   Medication Sig Start Date End Date Taking? Authorizing Provider  Ascorbic Acid (VITAMIN C) 1000 MG tablet Take 1,000 mg by mouth daily. 09/13/18   [provider]  ferrous sulfate  (FERROUSUL) 325 (65 FE) MG tablet Take 1 tablet (325 mg total) by mouth daily with breakfast. 01/24/20   Hildred Laser, MD  folic acid (FOLVITE) 1 MG tablet Take 1 tablet (1 mg total) by mouth daily. 10/05/19   Hildred Laser, MD  OVER THE COUNTER MEDICATION daily. Calm Magnesium take one tablet daily at bedtime to help with sleep.    [provider]  Prenatal Vit-Fe Fumarate-FA (PRENATAL MULTIVITAMIN) TABS tablet Take 1 tablet by mouth daily at 12 noon.    [provider]      PHYSICAL EXAMINATION:   VITAL SIGNS: Last menstrual period 06/19/2019.  GENERAL:  34 y.o.-year-old patient no acute distress.  HEENT: Head atraumatic, normocephalic. Oropharynx and nasopharynx clear. MP 2, TM distance >3 cm, normal mouth opening. LUNGS: Normal breath sounds bilaterally, no wheezing, rales,rhonchi. No use of accessory muscles of respiration.  CARDIOVASCULAR: S1, S2 normal. No murmurs, rubs, or gallops.  EXTREMITIES: No pedal edema, cyanosis, or clubbing.  NEUROLOGIC: normal gait PSYCHIATRIC: The patient is alert and oriented x 3.  SKIN: No obvious rash, lesion, or ulcer.    IMPRESSION AND PLAN:   Brendalee Matthies  is a 34 y.o. female presenting with obesity during pregnancy. BMI is currently 52 at [redacted] weeks gestation.   Airway exam reassuring today. Significant back adioposity, spinal interspaces minimally palpable.  We discussed analgesic options during labor including epidural analgesia. Discussed that in obesity there can be increased difficulty with epidural placement or even failure of successful epidural. We also discussed that even after successful epidural placement there is  increased risk of catheter migration out of the epidural space that would require catheter replacement. Discussed use of epidural vs spinal vs GA if cesarean delivery is required. Discussed increased risk of difficult intubation during pregnancy should an emergency cesarean delivery be required.   Plan for  delivery at [facility].

## 2020-02-21 ENCOUNTER — Encounter: Payer: Self-pay | Admitting: Obstetrics and Gynecology

## 2020-02-21 ENCOUNTER — Other Ambulatory Visit: Payer: Self-pay

## 2020-02-21 ENCOUNTER — Ambulatory Visit (INDEPENDENT_AMBULATORY_CARE_PROVIDER_SITE_OTHER): Payer: BC Managed Care – PPO | Admitting: Obstetrics and Gynecology

## 2020-02-21 VITALS — BP 124/76 | HR 83 | Wt 298.0 lb

## 2020-02-21 DIAGNOSIS — Z3A35 35 weeks gestation of pregnancy: Secondary | ICD-10-CM

## 2020-02-21 DIAGNOSIS — Z3483 Encounter for supervision of other normal pregnancy, third trimester: Secondary | ICD-10-CM

## 2020-02-21 NOTE — Progress Notes (Signed)
ROB: Patient with no complaints.  Maternity leave begins next week.  Weekly NSTs with visits.  Follow-up ultrasound scheduled for growth and amniotic fluid volume.  Cultures next visit.  Had her anesthesia follow-up consult-no significant issues.

## 2020-02-26 NOTE — Progress Notes (Signed)
ROB-Pt present for routine prenatal care and NST.  Pt stated having pressure in the lower abd area, braxton hicks contractions, hands and feet swelling and carpal tunnel symptoms in right wrist.

## 2020-02-26 NOTE — Patient Instructions (Signed)
Common Medications Safe in Pregnancy  Acne:      Constipation:  Benzoyl Peroxide     Colace  Clindamycin      Dulcolax Suppository  Topica Erythromycin     Fibercon  Salicylic Acid      Metamucil         Miralax AVOID:        Senakot   Accutane    Cough:  Retin-A       Cough Drops  Tetracycline      Phenergan w/ Codeine if Rx  Minocycline      Robitussin (Plain & DM)  Antibiotics:     Crabs/Lice:  Ceclor       RID  Cephalosporins    AVOID:  E-Mycins      Kwell  Keflex  Macrobid/Macrodantin   Diarrhea:  Penicillin      Kao-Pectate  Zithromax      Imodium AD         PUSH FLUIDS AVOID:       Cipro     Fever:  Tetracycline      Tylenol (Regular or Extra  Minocycline       Strength)  Levaquin      Extra Strength-Do not          Exceed 8 tabs/24 hrs Caffeine:        <200mg/day (equiv. To 1 cup of coffee or  approx. 3 12 oz sodas)         Gas: Cold/Hayfever:       Gas-X  Benadryl      Mylicon  Claritin       Phazyme  **Claritin-D        Chlor-Trimeton    Headaches:  Dimetapp      ASA-Free Excedrin  Drixoral-Non-Drowsy     Cold Compress  Mucinex (Guaifenasin)     Tylenol (Regular or Extra  Sudafed/Sudafed-12 Hour     Strength)  **Sudafed PE Pseudoephedrine   Tylenol Cold & Sinus     Vicks Vapor Rub  Zyrtec  **AVOID if Problems With Blood Pressure         Heartburn: Avoid lying down for at least 1 hour after meals  Aciphex      Maalox     Rash:  Milk of Magnesia     Benadryl    Mylanta       1% Hydrocortisone Cream  Pepcid  Pepcid Complete   Sleep Aids:  Prevacid      Ambien   Prilosec       Benadryl  Rolaids       Chamomile Tea  Tums (Limit 4/day)     Unisom  Zantac       Tylenol PM         Warm milk-add vanilla or  Hemorrhoids:       Sugar for taste  Anusol/Anusol H.C.  (RX: Analapram 2.5%)  Sugar Substitutes:  Hydrocortisone OTC     Ok in moderation  Preparation H      Tucks        Vaseline lotion applied to tissue with  wiping    Herpes:     Throat:  Acyclovir      Oragel  Famvir  Valtrex     Vaccines:         Flu Shot Leg Cramps:       *Gardasil  Benadryl      Hepatitis A         Hepatitis B Nasal Spray:         Pneumovax  Saline Nasal Spray     Polio Booster         Tetanus Nausea:       Tuberculosis test or PPD  Vitamin B6 25 mg TID   AVOID:    Dramamine      *Gardasil  Emetrol       Live Poliovirus  Ginger Root 250 mg QID    MMR (measles, mumps &  High Complex Carbs @ Bedtime    rebella)  Sea Bands-Accupressure    Varicella (Chickenpox)  Unisom 1/2 tab TID     *No known complications           If received before Pain:         Known pregnancy;   Darvocet       Resume series after  Lortab        Delivery  Percocet    Yeast:   Tramadol      Femstat  Tylenol 3      Gyne-lotrimin  Ultram       Monistat  Vicodin           MISC:         All Sunscreens           Hair Coloring/highlights          Insect Repellant's          (Including DEET)         Mystic Tans Breastfeeding  Choosing to breastfeed is one of the best decisions you can make for yourself and your baby. A change in hormones during pregnancy causes your breasts to make breast milk in your milk-producing glands. Hormones prevent breast milk from being released before your baby is born. They also prompt milk flow after birth. Once breastfeeding has begun, thoughts of your baby, as well as his or her sucking or crying, can stimulate the release of milk from your milk-producing glands. Benefits of breastfeeding Research shows that breastfeeding offers many health benefits for infants and mothers. It also offers a cost-free and convenient way to feed your baby. For your baby  Your first milk (colostrum) helps your baby's digestive system to function better.  Special cells in your milk (antibodies) help your baby to fight off infections.  Breastfed babies are less likely to develop asthma, allergies, obesity, or type 2 diabetes. They  are also at lower risk for sudden infant death syndrome (SIDS).  Nutrients in breast milk are better able to meet your baby's needs compared to infant formula.  Breast milk improves your baby's brain development. For you  Breastfeeding helps to create a very special bond between you and your baby.  Breastfeeding is convenient. Breast milk costs nothing and is always available at the correct temperature.  Breastfeeding helps to burn calories. It helps you to lose the weight that you gained during pregnancy.  Breastfeeding makes your uterus return faster to its size before pregnancy. It also slows bleeding (lochia) after you give birth.  Breastfeeding helps to lower your risk of developing type 2 diabetes, osteoporosis, rheumatoid arthritis, cardiovascular disease, and breast, ovarian, uterine, and endometrial cancer later in life. Breastfeeding basics Starting breastfeeding  Find a comfortable place to sit or lie down, with your neck and back well-supported.  Place a pillow or a rolled-up blanket under your baby to bring him or her to the level of your breast (if you are seated). Nursing pillows are specially designed to help support your arms and your baby while you   breastfeed.  Make sure that your baby's tummy (abdomen) is facing your abdomen.  Gently massage your breast. With your fingertips, massage from the outer edges of your breast inward toward the nipple. This encourages milk flow. If your milk flows slowly, you may need to continue this action during the feeding.  Support your breast with 4 fingers underneath and your thumb above your nipple (make the letter "C" with your hand). Make sure your fingers are well away from your nipple and your baby's mouth.  Stroke your baby's lips gently with your finger or nipple.  When your baby's mouth is open wide enough, quickly bring your baby to your breast, placing your entire nipple and as much of the areola as possible into your baby's  mouth. The areola is the colored area around your nipple. ? More areola should be visible above your baby's upper lip than below the lower lip. ? Your baby's lips should be opened and extended outward (flanged) to ensure an adequate, comfortable latch. ? Your baby's tongue should be between his or her lower gum and your breast.  Make sure that your baby's mouth is correctly positioned around your nipple (latched). Your baby's lips should create a seal on your breast and be turned out (everted).  It is common for your baby to suck about 2-3 minutes in order to start the flow of breast milk. Latching Teaching your baby how to latch onto your breast properly is very important. An improper latch can cause nipple pain, decreased milk supply, and poor weight gain in your baby. Also, if your baby is not latched onto your nipple properly, he or she may swallow some air during feeding. This can make your baby fussy. Burping your baby when you switch breasts during the feeding can help to get rid of the air. However, teaching your baby to latch on properly is still the best way to prevent fussiness from swallowing air while breastfeeding. Signs that your baby has successfully latched onto your nipple  Silent tugging or silent sucking, without causing you pain. Infant's lips should be extended outward (flanged).  Swallowing heard between every 3-4 sucks once your milk has started to flow (after your let-down milk reflex occurs).  Muscle movement above and in front of his or her ears while sucking. Signs that your baby has not successfully latched onto your nipple  Sucking sounds or smacking sounds from your baby while breastfeeding.  Nipple pain. If you think your baby has not latched on correctly, slip your finger into the corner of your baby's mouth to break the suction and place it between your baby's gums. Attempt to start breastfeeding again. Signs of successful breastfeeding Signs from your  baby  Your baby will gradually decrease the number of sucks or will completely stop sucking.  Your baby will fall asleep.  Your baby's body will relax.  Your baby will retain a small amount of milk in his or her mouth.  Your baby will let go of your breast by himself or herself. Signs from you  Breasts that have increased in firmness, weight, and size 1-3 hours after feeding.  Breasts that are softer immediately after breastfeeding.  Increased milk volume, as well as a change in milk consistency and color by the fifth day of breastfeeding.  Nipples that are not sore, cracked, or bleeding. Signs that your baby is getting enough milk  Wetting at least 1-2 diapers during the first 24 hours after birth.  Wetting at least 5-6   diapers every 24 hours for the first week after birth. The urine should be clear or pale yellow by the age of 5 days.  Wetting 6-8 diapers every 24 hours as your baby continues to grow and develop.  At least 3 stools in a 24-hour period by the age of 5 days. The stool should be soft and yellow.  At least 3 stools in a 24-hour period by the age of 7 days. The stool should be seedy and yellow.  No loss of weight greater than 10% of birth weight during the first 3 days of life.  Average weight gain of 4-7 oz (113-198 g) per week after the age of 4 days.  Consistent daily weight gain by the age of 5 days, without weight loss after the age of 2 weeks. After a feeding, your baby may spit up a small amount of milk. This is normal. Breastfeeding frequency and duration Frequent feeding will help you make more milk and can prevent sore nipples and extremely full breasts (breast engorgement). Breastfeed when you feel the need to reduce the fullness of your breasts or when your baby shows signs of hunger. This is called "breastfeeding on demand." Signs that your baby is hungry include:  Increased alertness, activity, or restlessness.  Movement of the head from side to  side.  Opening of the mouth when the corner of the mouth or cheek is stroked (rooting).  Increased sucking sounds, smacking lips, cooing, sighing, or squeaking.  Hand-to-mouth movements and sucking on fingers or hands.  Fussing or crying. Avoid introducing a pacifier to your baby in the first 4-6 weeks after your baby is born. After this time, you may choose to use a pacifier. Research has shown that pacifier use during the first year of a baby's life decreases the risk of sudden infant death syndrome (SIDS). Allow your baby to feed on each breast as long as he or she wants. When your baby unlatches or falls asleep while feeding from the first breast, offer the second breast. Because newborns are often sleepy in the first few weeks of life, you may need to awaken your baby to get him or her to feed. Breastfeeding times will vary from baby to baby. However, the following rules can serve as a guide to help you make sure that your baby is properly fed:  Newborns (babies 4 weeks of age or younger) may breastfeed every 1-3 hours.  Newborns should not go without breastfeeding for longer than 3 hours during the day or 5 hours during the night.  You should breastfeed your baby a minimum of 8 times in a 24-hour period. Breast milk pumping     Pumping and storing breast milk allows you to make sure that your baby is exclusively fed your breast milk, even at times when you are unable to breastfeed. This is especially important if you go back to work while you are still breastfeeding, or if you are not able to be present during feedings. Your lactation consultant can help you find a method of pumping that works best for you and give you guidelines about how long it is safe to store breast milk. Caring for your breasts while you breastfeed Nipples can become dry, cracked, and sore while breastfeeding. The following recommendations can help keep your breasts moisturized and healthy:  Avoid using soap on  your nipples.  Wear a supportive bra designed especially for nursing. Avoid wearing underwire-style bras or extremely tight bras (sports bras).  Air-dry your   nipples for 3-4 minutes after each feeding.  Use only cotton bra pads to absorb leaked breast milk. Leaking of breast milk between feedings is normal.  Use lanolin on your nipples after breastfeeding. Lanolin helps to maintain your skin's normal moisture barrier. Pure lanolin is not harmful (not toxic) to your baby. You may also hand express a few drops of breast milk and gently massage that milk into your nipples and allow the milk to air-dry. In the first few weeks after giving birth, some women experience breast engorgement. Engorgement can make your breasts feel heavy, warm, and tender to the touch. Engorgement peaks within 3-5 days after you give birth. The following recommendations can help to ease engorgement:  Completely empty your breasts while breastfeeding or pumping. You may want to start by applying warm, moist heat (in the shower or with warm, water-soaked hand towels) just before feeding or pumping. This increases circulation and helps the milk flow. If your baby does not completely empty your breasts while breastfeeding, pump any extra milk after he or she is finished.  Apply ice packs to your breasts immediately after breastfeeding or pumping, unless this is too uncomfortable for you. To do this: ? Put ice in a plastic bag. ? Place a towel between your skin and the bag. ? Leave the ice on for 20 minutes, 2-3 times a day.  Make sure that your baby is latched on and positioned properly while breastfeeding. If engorgement persists after 48 hours of following these recommendations, contact your health care provider or a lactation consultant. Overall health care recommendations while breastfeeding  Eat 3 healthy meals and 3 snacks every day. Well-nourished mothers who are breastfeeding need an additional 450-500 calories a day.  You can meet this requirement by increasing the amount of a balanced diet that you eat.  Drink enough water to keep your urine pale yellow or clear.  Rest often, relax, and continue to take your prenatal vitamins to prevent fatigue, stress, and low vitamin and mineral levels in your body (nutrient deficiencies).  Do not use any products that contain nicotine or tobacco, such as cigarettes and e-cigarettes. Your baby may be harmed by chemicals from cigarettes that pass into breast milk and exposure to secondhand smoke. If you need help quitting, ask your health care provider.  Avoid alcohol.  Do not use illegal drugs or marijuana.  Talk with your health care provider before taking any medicines. These include over-the-counter and prescription medicines as well as vitamins and herbal supplements. Some medicines that may be harmful to your baby can pass through breast milk.  It is possible to become pregnant while breastfeeding. If birth control is desired, ask your health care provider about options that will be safe while breastfeeding your baby. Where to find more information: [employer]: www.llli.org Contact a health care provider if:  You feel like you want to stop breastfeeding or have become frustrated with breastfeeding.  Your nipples are cracked or bleeding.  Your breasts are red, tender, or warm.  You have: ? Painful breasts or nipples. ? A swollen area on either breast. ? A fever or chills. ? Nausea or vomiting. ? Drainage other than breast milk from your nipples.  Your breasts do not become full before feedings by the fifth day after you give birth.  You feel sad and depressed.  Your baby is: ? Too sleepy to eat well. ? Having trouble sleeping. ? More than 1 week old and wetting fewer than 6   diapers in a 24-hour period. ? Not gaining weight by 5 days of age.  Your baby has fewer than 3 stools in a 24-hour period.  Your baby's skin or the white  parts of his or her eyes become yellow. Get help right away if:  Your baby is overly tired (lethargic) and does not want to wake up and feed.  Your baby develops an unexplained fever. Summary  Breastfeeding offers many health benefits for infant and mothers.  Try to breastfeed your infant when he or she shows early signs of hunger.  Gently tickle or stroke your baby's lips with your finger or nipple to allow the baby to open his or her mouth. Bring the baby to your breast. Make sure that much of the areola is in your baby's mouth. Offer one side and burp the baby before you offer the other side.  Talk with your health care provider or lactation consultant if you have questions or you face problems as you breastfeed. This information is not intended to replace advice given to you by your health care provider. Make sure you discuss any questions you have with your health care provider. Document Revised: 02/10/2018 Document Reviewed: 12/18/2016 Elsevier Patient Education  2020 [employer] Inc[employer]  

## 2020-02-27 ENCOUNTER — Other Ambulatory Visit: Payer: BC Managed Care – PPO

## 2020-02-27 ENCOUNTER — Other Ambulatory Visit: Payer: Self-pay | Admitting: Obstetrics and Gynecology

## 2020-02-27 ENCOUNTER — Other Ambulatory Visit: Payer: Self-pay

## 2020-02-27 ENCOUNTER — Encounter: Payer: Self-pay | Admitting: Obstetrics and Gynecology

## 2020-02-27 ENCOUNTER — Ambulatory Visit (INDEPENDENT_AMBULATORY_CARE_PROVIDER_SITE_OTHER): Payer: BC Managed Care – PPO | Admitting: Obstetrics and Gynecology

## 2020-02-27 VITALS — BP 104/70 | HR 87 | Wt 298.6 lb

## 2020-02-27 DIAGNOSIS — O99213 Obesity complicating pregnancy, third trimester: Secondary | ICD-10-CM

## 2020-02-27 DIAGNOSIS — O0993 Supervision of high risk pregnancy, unspecified, third trimester: Secondary | ICD-10-CM

## 2020-02-27 DIAGNOSIS — Z3A36 36 weeks gestation of pregnancy: Secondary | ICD-10-CM

## 2020-02-27 LAB — POCT URINALYSIS DIPSTICK OB
Bilirubin, UA: NEGATIVE
Blood, UA: NEGATIVE
Glucose, UA: NEGATIVE
Ketones, UA: NEGATIVE
Nitrite, UA: NEGATIVE
Spec Grav, UA: 1.015 (ref 1.010–1.025)
Urobilinogen, UA: 0.2 E.U./dL
pH, UA: 6 (ref 5.0–8.0)

## 2020-02-27 NOTE — Progress Notes (Addendum)
ROB: Patient with normal OB complaints of pregnancy (abdominal pressure, Braxton Hicks contractions, hand and feet swelling). Discussed alleviating measures. 36 week labs done today. Is s/p repeat anesthesia consult.  Cervical exam with external os 3 cm, internal os 1.5 cm. For growth scan next week with BPP. NST performed today was reviewed and was found to be reactive.  Continue recommended antenatal testing and prenatal care.   NONSTRESS TEST INTERPRETATION  INDICATIONS: Obesity (BMI 52)  FHR baseline: 145 bpm RESULTS:Reactive (with vibroacoustic stimulator) COMMENTS: uterine irritability   PLAN: 1. Continue fetal kick counts twice a day. 2. Continue antepartum testing as scheduled-weekly

## 2020-02-28 ENCOUNTER — Telehealth: Payer: Self-pay

## 2020-02-28 NOTE — Telephone Encounter (Signed)
Pt called to the office concerned since she lost her mucus plug and noticed light pink discharge. Pt denies any contractions, back pains, leaking of fluids or heavy vaginal bleeding. Pt was advised that if she noticed any of the above to please go the ED. Pt voiced that she understood.

## 2020-02-29 LAB — GC/CHLAMYDIA PROBE AMP
Chlamydia trachomatis, NAA: NEGATIVE
Neisseria Gonorrhoeae by PCR: NEGATIVE

## 2020-02-29 LAB — STREP GP B NAA: Strep Gp B NAA: NEGATIVE

## 2020-03-01 ENCOUNTER — Other Ambulatory Visit: Payer: Self-pay

## 2020-03-01 ENCOUNTER — Observation Stay
Admission: EM | Admit: 2020-03-01 | Discharge: 2020-03-02 | Disposition: A | Discharge status: Home / Self Care | Payer: BC Managed Care – PPO | Attending: Obstetrics and Gynecology | Admitting: Obstetrics and Gynecology

## 2020-03-01 DIAGNOSIS — R109 Unspecified abdominal pain: Secondary | ICD-10-CM | POA: Diagnosis not present

## 2020-03-01 DIAGNOSIS — Z3A36 36 weeks gestation of pregnancy: Secondary | ICD-10-CM | POA: Insufficient documentation

## 2020-03-01 DIAGNOSIS — Z3490 Encounter for supervision of normal pregnancy, unspecified, unspecified trimester: Secondary | ICD-10-CM

## 2020-03-01 DIAGNOSIS — O26893 Other specified pregnancy related conditions, third trimester: Secondary | ICD-10-CM | POA: Diagnosis not present

## 2020-03-01 MED ORDER — HYDROXYZINE HCL 25 MG PO TABS
25.0000 mg | ORAL_TABLET | Freq: Once | ORAL | Status: AC
  Administered 2020-03-02: 25 mg via ORAL
  Filled 2020-03-01: qty 1

## 2020-03-01 MED ORDER — ACETAMINOPHEN 500 MG PO TABS
1000.0000 mg | ORAL_TABLET | Freq: Once | ORAL | Status: AC
  Administered 2020-03-02: 1000 mg via ORAL
  Filled 2020-03-01: qty 2

## 2020-03-02 ENCOUNTER — Encounter: Payer: Self-pay | Admitting: Obstetrics and Gynecology

## 2020-03-02 ENCOUNTER — Observation Stay (HOSPITAL_BASED_OUTPATIENT_CLINIC_OR_DEPARTMENT_OTHER)
Admission: EM | Admit: 2020-03-02 | Discharge: 2020-03-02 | Disposition: A | Discharge status: Home / Self Care | Payer: BC Managed Care – PPO | Source: Home / Self Care | Admitting: Obstetrics and Gynecology

## 2020-03-02 ENCOUNTER — Observation Stay: Payer: BC Managed Care – PPO

## 2020-03-02 ENCOUNTER — Other Ambulatory Visit: Payer: Self-pay

## 2020-03-02 DIAGNOSIS — R109 Unspecified abdominal pain: Secondary | ICD-10-CM

## 2020-03-02 DIAGNOSIS — O99213 Obesity complicating pregnancy, third trimester: Secondary | ICD-10-CM

## 2020-03-02 DIAGNOSIS — Z3A36 36 weeks gestation of pregnancy: Secondary | ICD-10-CM

## 2020-03-02 DIAGNOSIS — O1203 Gestational edema, third trimester: Secondary | ICD-10-CM

## 2020-03-02 DIAGNOSIS — O26893 Other specified pregnancy related conditions, third trimester: Secondary | ICD-10-CM | POA: Diagnosis not present

## 2020-03-02 DIAGNOSIS — O26899 Other specified pregnancy related conditions, unspecified trimester: Secondary | ICD-10-CM

## 2020-03-02 DIAGNOSIS — O4703 False labor before 37 completed weeks of gestation, third trimester: Secondary | ICD-10-CM | POA: Diagnosis not present

## 2020-03-02 LAB — URINALYSIS, COMPLETE (UACMP) WITH MICROSCOPIC
Bilirubin Urine: NEGATIVE
Glucose, UA: NEGATIVE mg/dL
Hgb urine dipstick: NEGATIVE
Ketones, ur: 5 mg/dL — AB
Leukocytes,Ua: NEGATIVE
Nitrite: NEGATIVE
Protein, ur: NEGATIVE mg/dL
Specific Gravity, Urine: 1.004 — ABNORMAL LOW (ref 1.005–1.030)
pH: 7 (ref 5.0–8.0)

## 2020-03-02 MED ORDER — HYDROXYZINE HCL 25 MG PO TABS: 25.0000 mg | ORAL_TABLET | Freq: Four times a day (QID) | ORAL | 2 refills | Status: DC | PRN

## 2020-03-02 MED ORDER — CALCIUM CARBONATE ANTACID 500 MG PO CHEW: 2.0000 | CHEWABLE_TABLET | Freq: Once | ORAL | Status: DC

## 2020-03-02 MED ORDER — OXYCODONE-ACETAMINOPHEN 5-325 MG PO TABS
1.0000 | ORAL_TABLET | Freq: Once | ORAL | Status: AC
  Administered 2020-03-02: 1 via ORAL
  Filled 2020-03-02: qty 1

## 2020-03-02 MED ORDER — LACTATED RINGERS IV BOLUS
1000.0000 mL | Freq: Once | INTRAVENOUS | Status: AC
  Administered 2020-03-02: 1000 mL via INTRAVENOUS

## 2020-03-02 MED ORDER — HYDROXYZINE HCL 50 MG PO TABS
50.0000 mg | ORAL_TABLET | Freq: Once | ORAL | Status: AC
  Administered 2020-03-02: 22:00:00 50 mg via ORAL
  Filled 2020-03-02 (×2): qty 1

## 2020-03-02 MED ORDER — CYCLOBENZAPRINE HCL 10 MG PO TABS
10.0000 mg | ORAL_TABLET | Freq: Three times a day (TID) | ORAL | Status: DC | PRN
  Administered 2020-03-02: 15:00:00 10 mg via ORAL
  Filled 2020-03-02 (×3): qty 1

## 2020-03-02 MED ORDER — CALCIUM CARBONATE ANTACID 500 MG PO CHEW
CHEWABLE_TABLET | ORAL | Status: AC
  Filled 2020-03-02: qty 2

## 2020-03-02 NOTE — Discharge Instructions (Signed)
Abdominal Pain During Pregnancy  Abdominal pain is common during pregnancy, and has many possible causes. Some causes are more serious than others, and sometimes the cause is not known. Abdominal pain can be a sign that labor is starting. It can also be caused by normal growth and stretching of muscles and ligaments during pregnancy. Always tell your health care provider if you have any abdominal pain. Follow these instructions at home:  Do not have sex or put anything in your vagina until your pain goes away completely.  Get plenty of rest until your pain improves.  Drink enough fluid to keep your urine pale yellow.  Take over-the-counter and prescription medicines only as told by your health care provider.  Keep all follow-up visits as told by your health care provider. This is important. Contact a health care provider if:  Your pain continues or gets worse after resting.  You have lower abdominal pain that: ? Comes and goes at regular intervals. ? Spreads to your back. ? Is similar to menstrual cramps.  You have pain or burning when you urinate. Get help right away if:  You have a fever or chills.  You have vaginal bleeding.  You are leaking fluid from your vagina.  You are passing tissue from your vagina.  You have vomiting or diarrhea that lasts for more than 24 hours.  Your baby is moving less than usual.  You feel very weak or faint.  You have shortness of breath.  You develop severe pain in your upper abdomen. Summary  Abdominal pain is common during pregnancy, and has many possible causes.  If you experience abdominal pain during pregnancy, tell your health care provider right away.  Follow your health care provider's home care instructions and keep all follow-up visits as directed. This information is not intended to replace advice given to you by your health care provider. Make sure you discuss any questions you have with your health care  provider. Document Revised: 03/06/2019 Document Reviewed: 02/18/2017 Elsevier Patient Education  2020 [employer] Inc.  

## 2020-03-02 NOTE — Discharge Summary (Signed)
L&D OB Triage Note  Anna Baldwin is a 34 y.o. [id number] female at [redacted]w[redacted]d, EDD Estimated Date of Delivery: 03/25/20 who presented to triage for complaints of abdominal pain x 1 day.  She was evaluated by the nurses with no significant findings for preterm labor. Vital signs stable. An NST was performed and has been reviewed by MD. She was treated with PO Tylenol and Vistaril.   NST INTERPRETATION: Indications: rule out uterine contractions  Mode: External Baseline Rate (A): 145 bpm Variability: Moderate Accelerations: 15 x 15 Decelerations: None     Contraction Frequency (min): irritability  Impression: reactive   Plan: NST performed was reviewed and was found to be reactive. She was discharged home with bleeding/preterm labor precautions.  Continue routine prenatal care. Follow up with OB/GYN as previously scheduled.     Hildred Laser, MD  [facility]

## 2020-03-02 NOTE — Final Progress Note (Signed)
L&D OB Triage Note  HPI:  Anna Baldwin is a 34 y.o. [id number] female at [redacted]w[redacted]d. Estimated Date of Delivery: 03/25/20 who presented to triage for complaints of abdominal pain, 10/10 in lower and mid abdomen (mostly on left).   Of note, patient presented last night for similar complaint. She was treated with PO Tylenol and Vistaril with relief of symptoms.  Today, she notes she attempted to take Tylenol and used a heating pad but did not help.    OB History  Gravida Para Term Preterm AB Living  4 2 2   1 2   SAB TAB Ectopic Multiple Live Births  1       2    # Outcome Date GA Lbr Len/2nd Weight Sex Delivery Anes PTL Lv  4 Current           3 SAB 06/2018          2 Term 10/26/12 [redacted]w[redacted]d  3062 g M Vag-Spont   LIV  1 Term 10/10/07 [redacted]w[redacted]d  3345 g F Vag-Spont   LIV    Patient Active Problem List   Diagnosis Date Noted  . Abdominal pain affecting pregnancy 03/02/2020  . Abdominal pain in pregnancy, third trimester 03/02/2020  . Pregnancy 03/01/2020  . AFI (amniotic fluid index) increased 02/07/2020  . Indication for care in labor or delivery 01/21/2020    Past Medical History:  Diagnosis Date  . Family history of breast cancer   . Family history of ovarian cancer   . Medical history non-contributory    Family History  Problem Relation Age of Onset  . Breast cancer Maternal Aunt 30  . Ovarian cancer Maternal Grandmother 67  . Breast cancer Maternal Aunt 30  . Breast cancer Maternal Aunt 30  . Hyperlipidemia Mother   . Diabetes Father     Past Surgical History:  Procedure Laterality Date  . NO PAST SURGERIES     Social History   Socioeconomic History  . Marital status: Married    Spouse name: Not on file  . Number of children: Not on file  . Years of education: Not on file  . Highest education level: Not on file  Occupational History  . Not on file  Tobacco Use  . Smoking status: Never Smoker  . Smokeless tobacco: Never Used  Substance and Sexual Activity  .  Alcohol use: Not Currently  . Drug use: Never  . Sexual activity: Yes    Birth control/protection: None  Other Topics Concern  . Not on file  Social History Narrative  . Not on file   Social Determinants of Health   Financial Resource Strain:   . Difficulty of Paying Living Expenses:   Food Insecurity:   . Worried About 69 in the Last Year:   . Programme researcher, broadcasting/film/video in the Last Year:   Transportation Needs:   . Barista (Medical):   Freight forwarder Lack of Transportation (Non-Medical):   Physical Activity:   . Days of Exercise per Week:   . Minutes of Exercise per Session:   Stress:   . Feeling of Stress :   Social Connections:   . Frequency of Communication with Friends and Family:   . Frequency of Social Gatherings with Friends and Family:   . Attends Religious Services:   . Active Member of Clubs or Organizations:   . Attends [employer]land [employer] Meetings:   Banker Marital Status:   Intimate Partner Violence:   . Fear  of Current or Ex-Partner:   . Emotionally Abused:   [employer] Physically Abused:   . Sexually Abused:     No current facility-administered medications on file prior to encounter.   Current Outpatient Medications on File Prior to Encounter  Medication Sig Dispense Refill  . Ascorbic Acid (VITAMIN C) 1000 MG tablet Take 1,000 mg by mouth daily.    . ferrous sulfate (FERROUSUL) 325 (65 FE) MG tablet Take 1 tablet (325 mg total) by mouth daily with breakfast. 60 tablet 1  . folic acid (FOLVITE) 1 MG tablet Take 1 tablet (1 mg total) by mouth daily. 30 tablet 10  . Prenatal Vit-Fe Fumarate-FA (PRENATAL MULTIVITAMIN) TABS tablet Take 1 tablet by mouth daily at 12 noon.    Marland Kitchen OVER THE COUNTER MEDICATION daily. Calm Magnesium take one tablet daily at bedtime to help with sleep.      No Known Allergies   ROS:  General:Not Present- Fever, Weight Loss and Weight Gain. Skin:Not Present- Rash. HEENT:Not Present- Blurred Vision, Headache and Bleeding  Gums. Respiratory:Not Present- Difficulty Breathing. Breast:Not Present- Breast Mass. Cardiovascular:Not Present- Chest Pain, Elevated Blood Pressure, Fainting / Blacking Out and Shortness of Breath. Gastrointestinal:Not Present- Constipation, Nausea and Vomiting.  Present -  Abdominal Pain Female Genitourinary:Not Present- Frequency, Painful Urination, Pelvic Pain, Vaginal Bleeding, Vaginal Discharge, Contractions, regular, Fetal Movements Decreased, Urinary Complaints and Vaginal Fluid. Musculoskeletal:Not Present- Back Pain and Leg Cramps. Neurological:Not Present- Dizziness. Psychiatric:Not Present- Depression.     Physical Exam:  Blood pressure (!) 108/58, pulse 79, temperature 97.8 F (36.6 C), temperature source Oral, resp. rate 16, height 5\' 3"  (1.6 m), weight 132.9 kg, last menstrual period 06/19/2019. General appearance: alert and mild distress CVS exam: normal rate, regular rhythm, normal S1, S2, no murmurs, rubs, clicks or gallops. Lungs: Normal respiratory effort, chest expands symmetrically. Lungs are clear to auscultation, no crackles or wheezes. Abdomen: normal findings: bowel sounds normal, no masses palpable and soft, gravid and abnormal findings:  tenderness to palpation in left lower abdomen. Pelvic: deferred Extremities: extremities normal, atraumatic, no cyanosis or edema   NST INTERPRETATION: Indications: rule out uterine contractions  Mode: External Baseline Rate (A): 135 bpm Variability: Moderate Accelerations: 15 x 15 Decelerations: None     Contraction Frequency (min): none  Impression: reactive   Imaging:  06/21/2019 OB Limited CLINICAL DATA:  Abdominal pain.  Pregnant patient.  EXAM: LIMITED OBSTETRIC ULTRASOUND  FINDINGS: Number of Fetuses: 1  Heart Rate:  140 bpm  Movement: Yes  Presentation: Cephalic  Placental Location: Fundal to left lateral.  Previa: No  Amniotic Fluid (Subjective):  Within normal limits.  AFI: 23.0  cm  BPD: 8.6 cm 34 w  4 d  FL: 7.1 cm 36 w  4 d  MATERNAL FINDINGS:  Cervix:  Not well visualized.  Grossly closed  Uterus/Adnexae: No abnormality visualized.  IMPRESSION: 1. Single live intrauterine pregnancy with a measured gestational age of [redacted] weeks and 5 days, based on her first ultrasound. Current findings are consistent with the expected growth. No evidence of a pregnancy complication. No emergent finding.  This exam is performed on an emergent basis and does not comprehensively evaluate fetal size, dating, or anatomy; follow-up complete OB US should be considered if further fetal assessment is warranted.  Electronically Signed   By: [place] M.D.   On: 03/02/2020 14:47   Labs:  Results for orders placed or performed during the hospital encounter of 03/02/20  Urinalysis, Complete w Microscopic  Result Value Ref Range  Color, Urine STRAW (A) YELLOW   APPearance CLEAR (A) CLEAR   Specific Gravity, Urine 1.004 (L) 1.005 - 1.030   pH 7.0 5.0 - 8.0   Glucose, UA NEGATIVE NEGATIVE mg/dL   Hgb urine dipstick NEGATIVE NEGATIVE   Bilirubin Urine NEGATIVE NEGATIVE   Ketones, ur 5 (A) NEGATIVE mg/dL   Protein, ur NEGATIVE NEGATIVE mg/dL   Nitrite NEGATIVE NEGATIVE   Leukocytes,Ua NEGATIVE NEGATIVE   RBC / HPF 0-5 0 - 5 RBC/hpf   WBC, UA 0-5 0 - 5 WBC/hpf   Bacteria, UA RARE (A) NONE SEEN   Squamous Epithelial / LPF 0-5 0 - 5   Mucus PRESENT     Assessment:  34 y.o. [id number] at [redacted]w[redacted]d with:  1.  Abdominal pain in pregnancy  Plan:  1. Patient initially treated with Flexeril and PO hydration with no relief. She was then given PO dose of Percocet (only took 1 tablet, although offered 2 tablets due to fear of taking pain meds).  Patient notes pain decreased from 10/10 to 8/10.  Given IVF bolus, and a dose of Atarax 50 mg, which helped pain.  Given a dose of Tums for mild heartburn.  Ultrasound with no abnormalities noted.  UA normal.  Will send prescription for  Atarax to pharmacy.  2. Can d/c home. To f/u in office this week. Return to Emergency Room if symptoms worsen.     Rubie Maid, MD [facility]

## 2020-03-02 NOTE — OB Triage Note (Signed)
Patient came in for observation for preterm labor evaluation. Patient reports irregular uterine contractions since 1700 this evening. Patient rates pain 10/10. Patient denies leaking of fluid and denies vaginal bleeding and spotting. Vital signs stable and patient afebrile. FHR baseline 145 with moderate variability with accelerations 15 x 15 and no decelerations. Significant other at bedside. Monitors applied.

## 2020-03-02 NOTE — OB Triage Note (Signed)
Pt. presented to L/D triage with left-sided abdominal pain. It began suddenly at 5pm on 03/01/20. She was discharged from L/D early this morning after a decrease in pain from medication. She reports it has since increased in intensity, rated, 10/10, and is a constant stabbing pain.It was unrelieved by 1000 mg Tylenol at 0800. She reports no bleeding or LOF, positive fetal movement. She says that she cannot tell if she is having any urinary symptoms.Pt reports normal discharge.  The pain decreases in intensity with rest and increased with movement.  VSS. Monitors applied and assessing.

## 2020-03-02 NOTE — Progress Notes (Signed)
03/02/2020 11:48 PM  BP (!) 108/58 (BP Location: Left Arm)   Pulse 79   Temp 97.8 F (36.6 C) (Oral)   Resp 16   Ht 5\' 3"  (160 cm)   Wt 132.9 kg   LMP 06/19/2019   BMI 51.90 kg/m  Patient discharged per MD orders. Discharge instructions reviewed with patient and patient verbalized understanding. IV removed per policy. Prescriptions discussed and given to patient. Discharged via wheelchair escorted by RN.  06/21/2019, RN

## 2020-03-06 ENCOUNTER — Other Ambulatory Visit: Payer: Self-pay

## 2020-03-06 ENCOUNTER — Ambulatory Visit (INDEPENDENT_AMBULATORY_CARE_PROVIDER_SITE_OTHER): Payer: BC Managed Care – PPO | Admitting: Obstetrics and Gynecology

## 2020-03-06 ENCOUNTER — Encounter: Payer: Self-pay | Admitting: Obstetrics and Gynecology

## 2020-03-06 ENCOUNTER — Ambulatory Visit (INDEPENDENT_AMBULATORY_CARE_PROVIDER_SITE_OTHER): Payer: BC Managed Care – PPO

## 2020-03-06 VITALS — BP 116/78 | HR 77 | Wt 293.2 lb

## 2020-03-06 DIAGNOSIS — O0993 Supervision of high risk pregnancy, unspecified, third trimester: Secondary | ICD-10-CM | POA: Diagnosis not present

## 2020-03-06 DIAGNOSIS — O99213 Obesity complicating pregnancy, third trimester: Secondary | ICD-10-CM | POA: Diagnosis not present

## 2020-03-06 DIAGNOSIS — Z3A37 37 weeks gestation of pregnancy: Secondary | ICD-10-CM

## 2020-03-06 LAB — POCT URINALYSIS DIPSTICK OB
Bilirubin, UA: NEGATIVE
Blood, UA: NEGATIVE
Glucose, UA: NEGATIVE
Leukocytes, UA: NEGATIVE
Nitrite, UA: NEGATIVE
POC,PROTEIN,UA: NEGATIVE
Spec Grav, UA: 1.01 (ref 1.010–1.025)
Urobilinogen, UA: 0.2 E.U./dL
pH, UA: 5 (ref 5.0–8.0)

## 2020-03-06 NOTE — Progress Notes (Signed)
ROB: Abdominal pain resolved.  Reports active daily fetal movement.  Occasional contractions but not regular.  BPP today 8/8.  NST next week with visit.

## 2020-03-13 ENCOUNTER — Ambulatory Visit (INDEPENDENT_AMBULATORY_CARE_PROVIDER_SITE_OTHER): Payer: BC Managed Care – PPO | Admitting: Obstetrics and Gynecology

## 2020-03-13 ENCOUNTER — Encounter: Payer: Self-pay | Admitting: Obstetrics and Gynecology

## 2020-03-13 ENCOUNTER — Other Ambulatory Visit: Payer: BC Managed Care – PPO

## 2020-03-13 ENCOUNTER — Other Ambulatory Visit: Payer: Self-pay

## 2020-03-13 VITALS — BP 117/79 | HR 83 | Wt 298.0 lb

## 2020-03-13 DIAGNOSIS — Z3A38 38 weeks gestation of pregnancy: Secondary | ICD-10-CM

## 2020-03-13 DIAGNOSIS — O0993 Supervision of high risk pregnancy, unspecified, third trimester: Secondary | ICD-10-CM

## 2020-03-13 LAB — POCT URINALYSIS DIPSTICK OB
Bilirubin, UA: NEGATIVE
Blood, UA: NEGATIVE
Glucose, UA: NEGATIVE
Ketones, UA: NEGATIVE
Nitrite, UA: NEGATIVE
Spec Grav, UA: 1.01 (ref 1.010–1.025)
Urobilinogen, UA: 0.2 E.U./dL
pH, UA: 6.5 (ref 5.0–8.0)

## 2020-03-13 NOTE — Progress Notes (Signed)
ROB: Patient doing well, no complaints.  Discussed IOL next week for elevated BMI.  NST performed today was reviewed and was found to be reactive.  Continue recommended antenatal testing and prenatal care.  Scheduled for 03/22/2020.    NONSTRESS TEST INTERPRETATION  INDICATIONS: Obesity  FHR baseline: 145 RESULTS:Reactive COMMENTS: Uterine irritability.    PLAN: 1. Continue fetal kick counts twice a day. 2. Continue antepartum testing as scheduled-weekly

## 2020-03-13 NOTE — Progress Notes (Signed)
ROB-Pt present for routine prenatal care. Pt stated that she was doing well no major problems.

## 2020-03-13 NOTE — Patient Instructions (Addendum)
COVID 19 Instructions for Scheduled Procedure (Inductions/C-sections and GYN surgeries)   Thank you for choosing [facility] for your services.  You have been scheduled for a procedure called __________Induction of Labor________________________.    Your procedure is scheduled on ______________Friday, March 22, 2020 at 5:00 a.m._______________.  You are required to have COVID-19 testing performed 2 days prior to your scheduled procedure date.  Testing is performed between 9 and 10 AM Monday through Friday.  Please present for testing on _____Wednesday, April 21, 2021___________ during this hour. Drive up testing is performed in front of [facility] (this is next to the [employer] [facility]).    Upon your scheduled procedure date, you will need to arrive at the [facility] entrance. (There is a statue at the front of this entrance.)   Please arrive on time if you are scheduled for an induction of labor.   If you are scheduled for a Cesarean delivery or for Gyn Surgery, arrive 2 hours prior to your procedure time.   If you are an Obstetric patient and your arrival time falls between 11 PM and 6 AM call L&D [phone]) when you arrive.  A staff member will meet you at the [facility] entrance.  At this time, patients are allowed 1 support person to accompany them. Face masks are required for you and your support person. Your support person is now allowed to be there with you during the entire time of your admission.   Please contact the office if you have any questions regarding this information.  The [facility] office number is [phone].     Thank you,    Your [facility] Providers        Labor Induction  Labor induction is when steps are taken to cause a pregnant woman to begin the labor process. Most women go into labor on their own between 37 weeks and 42 weeks of pregnancy. When this does not happen or when there is a medical need for labor to begin,  steps may be taken to induce labor. Labor induction causes a pregnant woman's uterus to contract. It also causes the cervix to soften (ripen), open (dilate), and thin out (efface). Usually, labor is not induced before 39 weeks of pregnancy unless there is a medical reason to do so. Your health care provider will determine if labor induction is needed. Before inducing labor, your health care provider will consider a number of factors, including:  Your medical condition and your baby's.  How many weeks along you are in your pregnancy.  How mature your baby's lungs are.  The condition of your cervix.  The position of your baby.  The size of your birth canal. What are some reasons for labor induction? Labor may be induced if:  Your health or your baby's health is at risk.  Your pregnancy is overdue by 1 week or more.  Your water breaks but labor does not start on its own.  There is a low amount of amniotic fluid around your baby. You may also choose (elect) to have labor induced at a certain time. Generally, elective labor induction is done no earlier than 39 weeks of pregnancy. What methods are used for labor induction? Methods used for labor induction include:  Prostaglandin medicine. This medicine starts contractions and causes the cervix to dilate and ripen. It can be taken by mouth (orally) or by being inserted into the vagina (suppository).  Inserting a small, thin tube (catheter) with  a balloon into the vagina and then expanding the balloon with water to dilate the cervix.  Stripping the membranes. In this method, your health care provider gently separates amniotic sac tissue from the cervix. This causes the cervix to stretch, which in turn causes the release of a hormone called progesterone. The hormone causes the uterus to contract. This procedure is often done during an office visit, after which you will be sent home to wait for contractions to begin.  Breaking the water. In  this method, your health care provider uses a small instrument to make a small hole in the amniotic sac. This eventually causes the amniotic sac to break. Contractions should begin after a few hours.  Medicine to trigger or strengthen contractions. This medicine is given through an IV that is inserted into a vein in your arm. Except for membrane stripping, which can be done in a clinic, labor induction is done in the hospital so that you and your baby can be carefully monitored. How long does it take for labor to be induced? The length of time it takes to induce labor depends on how ready your body is for labor. Some inductions can take up to 2-3 days, while others may take less than a day. Induction may take longer if:  You are induced early in your pregnancy.  It is your first pregnancy.  Your cervix is not ready. What are some risks associated with labor induction? Some risks associated with labor induction include:  Changes in fetal heart rate, such as being too high, too low, or irregular (erratic).  Failed induction.  Infection in the mother or the baby.  Increased risk of having a cesarean delivery.  Fetal death.  Breaking off (abruption) of the placenta from the uterus (rare).  Rupture of the uterus (very rare). When induction is needed for medical reasons, the benefits of induction generally outweigh the risks. What are some reasons for not inducing labor? Labor induction should not be done if:  Your baby does not tolerate contractions.  You have had previous surgeries on your uterus, such as a myomectomy, removal of fibroids, or a vertical scar from a previous cesarean delivery.  Your placenta lies very low in your uterus and blocks the opening of the cervix (placenta previa).  Your baby is not in a head-down position.  The umbilical cord drops down into the birth canal in front of the baby.  There are unusual circumstances, such as the baby being very early  (premature).  You have had more than 2 previous cesarean deliveries. Summary  Labor induction is when steps are taken to cause a pregnant woman to begin the labor process.  Labor induction causes a pregnant woman's uterus to contract. It also causes the cervix to ripen, dilate, and efface.  Labor is not induced before 39 weeks of pregnancy unless there is a medical reason to do so.  When induction is needed for medical reasons, the benefits of induction generally outweigh the risks. This information is not intended to replace advice given to you by your health care provider. Make sure you discuss any questions you have with your health care provider. Document Revised: 11/19/2017 Document Reviewed: 12/30/2016 Elsevier Patient Education  2020 [employer].

## 2020-03-14 ENCOUNTER — Other Ambulatory Visit: Payer: BC Managed Care – PPO

## 2020-03-14 ENCOUNTER — Encounter: Payer: BC Managed Care – PPO | Admitting: Obstetrics and Gynecology

## 2020-03-14 NOTE — Addendum Note (Signed)
Addended by: Fabian November on: 03/14/2020 06:00 PM   Modules accepted: Orders, SmartSet

## 2020-03-20 ENCOUNTER — Other Ambulatory Visit: Payer: Self-pay

## 2020-03-20 ENCOUNTER — Ambulatory Visit (INDEPENDENT_AMBULATORY_CARE_PROVIDER_SITE_OTHER): Payer: BC Managed Care – PPO | Admitting: Obstetrics and Gynecology

## 2020-03-20 ENCOUNTER — Other Ambulatory Visit
Admission: RE | Admit: 2020-03-20 | Discharge: 2020-03-20 | Disposition: A | Discharge status: Home / Self Care | Payer: BC Managed Care – PPO | Source: Ambulatory Visit | Attending: Obstetrics and Gynecology | Admitting: Obstetrics and Gynecology

## 2020-03-20 ENCOUNTER — Other Ambulatory Visit: Payer: BC Managed Care – PPO

## 2020-03-20 ENCOUNTER — Encounter: Payer: Self-pay | Admitting: Obstetrics and Gynecology

## 2020-03-20 VITALS — BP 121/88 | HR 83 | Wt 297.0 lb

## 2020-03-20 DIAGNOSIS — O0993 Supervision of high risk pregnancy, unspecified, third trimester: Secondary | ICD-10-CM

## 2020-03-20 DIAGNOSIS — Z20822 Contact with and (suspected) exposure to covid-19: Secondary | ICD-10-CM | POA: Insufficient documentation

## 2020-03-20 DIAGNOSIS — Z01812 Encounter for preprocedural laboratory examination: Secondary | ICD-10-CM | POA: Insufficient documentation

## 2020-03-20 DIAGNOSIS — O99213 Obesity complicating pregnancy, third trimester: Secondary | ICD-10-CM

## 2020-03-20 DIAGNOSIS — Z3A39 39 weeks gestation of pregnancy: Secondary | ICD-10-CM | POA: Diagnosis not present

## 2020-03-20 LAB — POCT URINALYSIS DIPSTICK OB
Bilirubin, UA: NEGATIVE
Blood, UA: NEGATIVE
Glucose, UA: NEGATIVE
Ketones, UA: NEGATIVE
Leukocytes, UA: NEGATIVE
Nitrite, UA: NEGATIVE
Spec Grav, UA: 1.01 (ref 1.010–1.025)
Urobilinogen, UA: 0.2 E.U./dL
pH, UA: 7 (ref 5.0–8.0)

## 2020-03-20 LAB — SARS CORONAVIRUS 2 (TAT 6-24 HRS): SARS Coronavirus 2: NEGATIVE

## 2020-03-20 NOTE — Progress Notes (Signed)
ROB: No complaints.  " I'm ready"     NST reactive.  Patient scheduled for induction on Friday.

## 2020-03-22 ENCOUNTER — Inpatient Hospital Stay: Payer: BC Managed Care – PPO | Admitting: Anesthesiology

## 2020-03-22 ENCOUNTER — Other Ambulatory Visit: Payer: Self-pay

## 2020-03-22 ENCOUNTER — Inpatient Hospital Stay
Admission: EM | Admit: 2020-03-22 | Discharge: 2020-03-24 | DRG: 807 | Disposition: A | Discharge status: Home / Self Care | Payer: BC Managed Care – PPO | Attending: Obstetrics and Gynecology | Admitting: Obstetrics and Gynecology

## 2020-03-22 ENCOUNTER — Encounter: Payer: Self-pay | Admitting: Obstetrics and Gynecology

## 2020-03-22 DIAGNOSIS — Z3A39 39 weeks gestation of pregnancy: Secondary | ICD-10-CM

## 2020-03-22 DIAGNOSIS — O99214 Obesity complicating childbirth: Principal | ICD-10-CM | POA: Diagnosis present

## 2020-03-22 DIAGNOSIS — O9081 Anemia of the puerperium: Secondary | ICD-10-CM | POA: Diagnosis not present

## 2020-03-22 DIAGNOSIS — Z20822 Contact with and (suspected) exposure to covid-19: Secondary | ICD-10-CM | POA: Diagnosis present

## 2020-03-22 DIAGNOSIS — O99213 Obesity complicating pregnancy, third trimester: Secondary | ICD-10-CM

## 2020-03-22 DIAGNOSIS — O409XX0 Polyhydramnios, unspecified trimester, not applicable or unspecified: Secondary | ICD-10-CM

## 2020-03-22 DIAGNOSIS — O289 Unspecified abnormal findings on antenatal screening of mother: Secondary | ICD-10-CM

## 2020-03-22 LAB — TYPE AND SCREEN
ABO/RH(D): A POS
Antibody Screen: NEGATIVE

## 2020-03-22 LAB — ABO/RH: ABO/RH(D): A POS

## 2020-03-22 LAB — CBC
HCT: 32.7 % — ABNORMAL LOW (ref 36.0–46.0)
Hemoglobin: 10.5 g/dL — ABNORMAL LOW (ref 12.0–15.0)
MCH: 24.8 pg — ABNORMAL LOW (ref 26.0–34.0)
MCHC: 32.1 g/dL (ref 30.0–36.0)
MCV: 77.3 fL — ABNORMAL LOW (ref 80.0–100.0)
Platelets: 236 10*3/uL (ref 150–400)
RBC: 4.23 MIL/uL (ref 3.87–5.11)
RDW: 16.4 % — ABNORMAL HIGH (ref 11.5–15.5)
WBC: 9.4 10*3/uL (ref 4.0–10.5)
nRBC: 0 % (ref 0.0–0.2)

## 2020-03-22 LAB — RPR: RPR Ser Ql: NONREACTIVE

## 2020-03-22 MED ORDER — PHENYLEPHRINE 40 MCG/ML (10ML) SYRINGE FOR IV PUSH (FOR BLOOD PRESSURE SUPPORT): 80.0000 ug | PREFILLED_SYRINGE | INTRAVENOUS | Status: DC | PRN

## 2020-03-22 MED ORDER — LACTATED RINGERS IV SOLN: 500.0000 mL | INTRAVENOUS | Status: DC | PRN

## 2020-03-22 MED ORDER — LIDOCAINE-EPINEPHRINE (PF) 1.5 %-1:200000 IJ SOLN
INTRAMUSCULAR | Status: DC | PRN
  Administered 2020-03-22: 3 mL via PERINEURAL

## 2020-03-22 MED ORDER — ONDANSETRON HCL 4 MG/2ML IJ SOLN: 4.0000 mg | INTRAMUSCULAR | Status: DC | PRN

## 2020-03-22 MED ORDER — WITCH HAZEL-GLYCERIN EX PADS: 1.0000 "application " | MEDICATED_PAD | CUTANEOUS | Status: DC | PRN

## 2020-03-22 MED ORDER — SOD CITRATE-CITRIC ACID 500-334 MG/5ML PO SOLN: 30.0000 mL | ORAL | Status: DC | PRN

## 2020-03-22 MED ORDER — OXYTOCIN BOLUS FROM INFUSION: 500.0000 mL | Freq: Once | INTRAVENOUS | Status: AC

## 2020-03-22 MED ORDER — OXYCODONE-ACETAMINOPHEN 5-325 MG PO TABS: 1.0000 | ORAL_TABLET | ORAL | Status: DC | PRN

## 2020-03-22 MED ORDER — TERBUTALINE SULFATE 1 MG/ML IJ SOLN: 0.2500 mg | Freq: Once | INTRAMUSCULAR | Status: DC | PRN

## 2020-03-22 MED ORDER — DIPHENHYDRAMINE HCL 50 MG/ML IJ SOLN: 12.5000 mg | INTRAMUSCULAR | Status: DC | PRN

## 2020-03-22 MED ORDER — OXYTOCIN 40 UNITS IN NORMAL SALINE INFUSION - SIMPLE MED
2.5000 [IU]/h | INTRAVENOUS | Status: DC
  Administered 2020-03-22: 2.5 [IU]/h via INTRAVENOUS

## 2020-03-22 MED ORDER — LACTATED RINGERS IV SOLN: INTRAVENOUS | Status: DC

## 2020-03-22 MED ORDER — EPHEDRINE 5 MG/ML INJ: 10.0000 mg | INTRAVENOUS | Status: DC | PRN

## 2020-03-22 MED ORDER — OXYTOCIN 40 UNITS IN NORMAL SALINE INFUSION - SIMPLE MED
INTRAVENOUS | Status: AC
  Administered 2020-03-22: 13:00:00 500 mL via INTRAVENOUS
  Filled 2020-03-22: qty 1000

## 2020-03-22 MED ORDER — BUTORPHANOL TARTRATE 1 MG/ML IJ SOLN: 1.0000 mg | INTRAMUSCULAR | Status: DC | PRN

## 2020-03-22 MED ORDER — LIDOCAINE HCL (PF) 1 % IJ SOLN
30.0000 mL | INTRAMUSCULAR | Status: AC | PRN
  Administered 2020-03-22: 3 mL via SUBCUTANEOUS

## 2020-03-22 MED ORDER — DIPHENHYDRAMINE HCL 25 MG PO CAPS: 25.0000 mg | ORAL_CAPSULE | Freq: Four times a day (QID) | ORAL | Status: DC | PRN

## 2020-03-22 MED ORDER — LACTATED RINGERS IV BOLUS: 1000.0000 mL | Freq: Once | INTRAVENOUS | Status: DC

## 2020-03-22 MED ORDER — ONDANSETRON HCL 4 MG/2ML IJ SOLN: 4.0000 mg | Freq: Four times a day (QID) | INTRAMUSCULAR | Status: DC | PRN

## 2020-03-22 MED ORDER — BUPIVACAINE HCL (PF) 0.25 % IJ SOLN
INTRAMUSCULAR | Status: DC | PRN
  Administered 2020-03-22: 4 mL via EPIDURAL
  Administered 2020-03-22: 5 mL via EPIDURAL

## 2020-03-22 MED ORDER — BENZOCAINE-MENTHOL 20-0.5 % EX AERO: 1.0000 "application " | INHALATION_SPRAY | CUTANEOUS | Status: DC | PRN

## 2020-03-22 MED ORDER — DIBUCAINE (PERIANAL) 1 % EX OINT: 1.0000 "application " | TOPICAL_OINTMENT | CUTANEOUS | Status: DC | PRN

## 2020-03-22 MED ORDER — FENTANYL 2.5 MCG/ML W/ROPIVACAINE 0.15% IN NS 100 ML EPIDURAL (ARMC)
12.0000 mL/h | EPIDURAL | Status: DC
  Administered 2020-03-22: 12 mL/h via EPIDURAL

## 2020-03-22 MED ORDER — OXYCODONE-ACETAMINOPHEN 5-325 MG PO TABS: 2.0000 | ORAL_TABLET | ORAL | Status: DC | PRN

## 2020-03-22 MED ORDER — MISOPROSTOL 200 MCG PO TABS
ORAL_TABLET | ORAL | Status: AC
  Administered 2020-03-22: 50 ug via VAGINAL
  Filled 2020-03-22: qty 4

## 2020-03-22 MED ORDER — IBUPROFEN 800 MG PO TABS
800.0000 mg | ORAL_TABLET | Freq: Four times a day (QID) | ORAL | Status: DC
  Administered 2020-03-22 – 2020-03-24 (×7): 800 mg via ORAL
  Filled 2020-03-22 (×7): qty 1

## 2020-03-22 MED ORDER — ACETAMINOPHEN 325 MG PO TABS: 650.0000 mg | ORAL_TABLET | ORAL | Status: DC | PRN

## 2020-03-22 MED ORDER — SENNOSIDES-DOCUSATE SODIUM 8.6-50 MG PO TABS
2.0000 | ORAL_TABLET | ORAL | Status: DC
  Administered 2020-03-23: 02:00:00 2 via ORAL
  Filled 2020-03-22 (×2): qty 2

## 2020-03-22 MED ORDER — MISOPROSTOL 50MCG HALF TABLET
50.0000 ug | ORAL_TABLET | ORAL | Status: DC | PRN
  Administered 2020-03-22: 11:00:00 50 ug via VAGINAL
  Filled 2020-03-22 (×2): qty 1

## 2020-03-22 MED ORDER — ZOLPIDEM TARTRATE 5 MG PO TABS: 5.0000 mg | ORAL_TABLET | Freq: Every evening | ORAL | Status: DC | PRN

## 2020-03-22 MED ORDER — AMMONIA AROMATIC IN INHA
RESPIRATORY_TRACT | Status: AC
  Filled 2020-03-22: qty 10

## 2020-03-22 MED ORDER — ONDANSETRON HCL 4 MG PO TABS: 4.0000 mg | ORAL_TABLET | ORAL | Status: DC | PRN

## 2020-03-22 MED ORDER — SIMETHICONE 80 MG PO CHEW: 80.0000 mg | CHEWABLE_TABLET | ORAL | Status: DC | PRN

## 2020-03-22 MED ORDER — LIDOCAINE HCL (PF) 1 % IJ SOLN
INTRAMUSCULAR | Status: AC
  Filled 2020-03-22: qty 30

## 2020-03-22 MED ORDER — FENTANYL 2.5 MCG/ML W/ROPIVACAINE 0.15% IN NS 100 ML EPIDURAL (ARMC)
EPIDURAL | Status: AC
  Filled 2020-03-22: qty 100

## 2020-03-22 MED ORDER — COCONUT OIL OIL: 1.0000 "application " | TOPICAL_OIL | Status: DC | PRN

## 2020-03-22 MED ORDER — OXYTOCIN 10 UNIT/ML IJ SOLN
INTRAMUSCULAR | Status: AC
  Filled 2020-03-22: qty 2

## 2020-03-22 MED ORDER — PRENATAL MULTIVITAMIN CH
1.0000 | ORAL_TABLET | Freq: Every day | ORAL | Status: DC
  Administered 2020-03-22 – 2020-03-23 (×2): 1 via ORAL
  Filled 2020-03-22 (×2): qty 1

## 2020-03-22 MED ORDER — LACTATED RINGERS IV SOLN
500.0000 mL | Freq: Once | INTRAVENOUS | Status: AC
  Administered 2020-03-22: 500 mL via INTRAVENOUS

## 2020-03-22 NOTE — Discharge Instructions (Signed)

## 2020-03-22 NOTE — H&P (Signed)
Obstetric History and Physical  Anna Baldwin is a 34 y.o. [phone] with IUP at [redacted]w[redacted]d presenting for scheduled induction of labor due to morbid obesity in pregnancy. Patient states she has been having  occasional contractions, no vaginal bleeding, intact membranes, with active fetal movement.    Prenatal Course Source of Care: [facility] with onset of care at 8 weeks Pregnancy complications or risks: Patient Active Problem List   Diagnosis Date Noted  . Obesity in pregnancy, antepartum, third trimester 03/22/2020  . Abdominal pain affecting pregnancy 03/02/2020  . Abdominal pain in pregnancy, third trimester 03/02/2020  . Pregnancy 03/01/2020  . AFI (amniotic fluid index) increased 02/07/2020  . Indication for care in labor or delivery 01/21/2020   She plans to breastfeed She desires unsure method for postpartum contraception, but considering Copper IUD.   Prenatal labs and studies: ABO, Rh: --/--/PENDING (04/23 0720) Antibody: PENDING (04/23 0720) Rubella: 1.70 (09/18 1027) RPR: Non Reactive (02/04 1158)  HBsAg: Negative (09/18 1027)  HIV: Non Reactive (09/18 1027)  ALP:FXTKWIOX/-- (03/30 1656) 1 hr Glucola  normal Genetic screening normal Anatomy US normal   OB History  Gravida Para Term Preterm AB Living  4 2 2   1 2   SAB TAB Ectopic Multiple Live Births  1       2    # Outcome Date GA Lbr Len/2nd Weight Sex Delivery Anes PTL Lv  4 Current           3 SAB 06/2018          2 Term 10/26/12 [redacted]w[redacted]d  3062 g M Vag-Spont   LIV  1 Term 10/10/07 [redacted]w[redacted]d  3345 g F Vag-Spont   LIV     Past Medical History:  Diagnosis Date  . Family history of breast cancer   . Family history of ovarian cancer   . Medical history non-contributory     Past Surgical History:  Procedure Laterality Date  . NO PAST SURGERIES      Social History   Socioeconomic History  . Marital status: Married    Spouse name: Not on file  . Number of children: Not on file  . Years of  education: Not on file  . Highest education level: Not on file  Occupational History  . Not on file  Tobacco Use  . Smoking status: Never Smoker  . Smokeless tobacco: Never Used  Substance and Sexual Activity  . Alcohol use: Not Currently  . Drug use: Never  . Sexual activity: Yes    Birth control/protection: None  Other Topics Concern  . Not on file  Social History Narrative  . Not on file   Social Determinants of Health   Financial Resource Strain:   . Difficulty of Paying Living Expenses:   Food Insecurity:   . Worried About Charity fundraiser in the Last Year:   . Arboriculturist in the Last Year:   Transportation Needs:   . Film/video editor (Medical):   Marland Kitchen Lack of Transportation (Non-Medical):   Physical Activity:   . Days of Exercise per Week:   . Minutes of Exercise per Session:   Stress:   . Feeling of Stress :   Social Connections:   . Frequency of Communication with Friends and Family:   . Frequency of Social Gatherings with Friends and Family:   . Attends Religious Services:   . Active Member of Clubs or Organizations:   . Attends Archivist Meetings:   .  Marital Status:     Family History  Problem Relation Age of Onset  . Breast cancer Maternal Aunt 30  . Ovarian cancer Maternal Grandmother 44  . Breast cancer Maternal Aunt 30  . Breast cancer Maternal Aunt 30  . Hyperlipidemia Mother   . Diabetes Father     Medications Prior to Admission  Medication Sig Dispense Refill Last Dose  . Ascorbic Acid (VITAMIN C) 1000 MG tablet Take 1,000 mg by mouth daily.   Past Week at Unknown time  . ferrous sulfate (FERROUSUL) 325 (65 FE) MG tablet Take 1 tablet (325 mg total) by mouth daily with breakfast. 60 tablet 1 03/21/2020 at Unknown time  . folic acid (FOLVITE) 1 MG tablet Take 1 tablet (1 mg total) by mouth daily. 30 tablet 10 03/21/2020 at Unknown time  . Prenatal Vit-Fe Fumarate-FA (PRENATAL MULTIVITAMIN) TABS tablet Take 1 tablet by mouth  daily at 12 noon.   03/21/2020 at Unknown time  . hydrOXYzine (ATARAX/VISTARIL) 25 MG tablet Take 1 tablet (25 mg total) by mouth every 6 (six) hours as needed (uterine irritability). May take up to 2 tablets for pain as needed every 6 hrs (Patient not taking: Reported on 03/22/2020) 30 tablet 2 Not Taking at Unknown time  . OVER THE COUNTER MEDICATION daily. Calm Magnesium take one tablet daily at bedtime to help with sleep.       No Known Allergies  Review of Systems: Negative except for what is mentioned in HPI.  Physical Exam: BP (!) 149/89 (BP Location: Left Arm)   Pulse 78   Temp 97.9 F (36.6 C) (Oral)   Resp 18   Ht 5\' 3"  (1.6 m)   Wt 134.7 kg   LMP 06/19/2019   BMI 52.61 kg/m .  Repeat BP 107/53 CONSTITUTIONAL: Well-developed, well-nourished female in no acute distress.  HENT:  Normocephalic, atraumatic, External right and left ear normal. Oropharynx is clear and moist EYES: Conjunctivae and EOM are normal. Pupils are equal, round, and reactive to light. No scleral icterus.  NECK: Normal range of motion, supple, no masses SKIN: Skin is warm and dry. No rash noted. Not diaphoretic. No erythema. No pallor. NEUROLOGIC: Alert and oriented to person, place, and time. Normal reflexes, muscle tone coordination. No cranial nerve deficit noted. PSYCHIATRIC: Normal mood and affect. Normal behavior. Normal judgment and thought content. CARDIOVASCULAR: Normal heart rate noted, regular rhythm RESPIRATORY: Effort and breath sounds normal, no problems with respiration noted ABDOMEN: Soft, nontender, nondistended, gravid. MUSCULOSKELETAL: Normal range of motion. No edema and no tenderness. 2+ distal pulses.  Cervical Exam: Dilatation 2 cm   Effacement 40%   Station ballotable   Presentation: cephalic FHT:  Baseline rate 140 bpm   Variability moderate  Accelerations present   Decelerations none Contractions: Uterine irritability   Pertinent Labs/Studies:   Results for orders placed or  performed during the hospital encounter of 03/22/20 (from the past 24 hour(s))  CBC     Status: Abnormal   Collection Time: 03/22/20  5:54 AM  Result Value Ref Range   WBC 9.4 4.0 - 10.5 K/uL   RBC 4.23 3.87 - 5.11 MIL/uL   Hemoglobin 10.5 (L) 12.0 - 15.0 g/dL   HCT 03/24/20 (L) 93.7 - 16.9 %   MCV 77.3 (L) 80.0 - 100.0 fL   MCH 24.8 (L) 26.0 - 34.0 pg   MCHC 32.1 30.0 - 36.0 g/dL   RDW 67.8 (H) 93.8 - 10.1 %   Platelets 236 150 - 400 K/uL  nRBC 0.0 0.0 - 0.2 %  Type and screen     Status: None (Preliminary result)   Collection Time: 03/22/20  5:54 AM  Result Value Ref Range   ABO/RH(D) PENDING    Antibody Screen PENDING    Sample Expiration      03/25/2020,2359 Performed at [facility], [address] [place], [place] [address]   Type and screen     Status: None (Preliminary result)   Collection Time: 03/22/20  7:20 AM  Result Value Ref Range   ABO/RH(D) PENDING    Antibody Screen PENDING    Sample Expiration      03/25/2020,2359 Performed at [facility], [address] [place], [place] [address]     Assessment : Anna Baldwin is a 34 y.o. [id number] at [redacted]w[redacted]d being admitted for induction of labor due to morbid obesity in pregnancy.  Plan: Labor: Induction as ordered as per protocol with Cytotec. Analgesia as needed. FWB: Reassuring fetal heart tracing.  GBS negative Delivery plan: Hopeful for vaginal delivery   Hildred Laser, MD [facility]

## 2020-03-22 NOTE — Anesthesia Preprocedure Evaluation (Signed)
Anesthesia Evaluation  Patient identified by MRN, date of birth, ID band Patient awake    Reviewed: Allergy & Precautions, H&P , NPO status , Patient's Chart, lab work & pertinent test results  History of Anesthesia Complications Negative for: history of anesthetic complications  Airway Mallampati: II  TM Distance: <3 FB Neck ROM: full    Dental no notable dental hx.    Pulmonary neg pulmonary ROS,    Pulmonary exam normal        Cardiovascular negative cardio ROS Normal cardiovascular exam     Neuro/Psych negative neurological ROS  negative psych ROS   GI/Hepatic negative GI ROS, Neg liver ROS,   Endo/Other  negative endocrine ROS  Renal/GU negative Renal ROS  negative genitourinary   Musculoskeletal   Abdominal   Peds  Hematology negative hematology ROS (+)   Anesthesia Other Findings   Reproductive/Obstetrics (+) Pregnancy                             Anesthesia Physical Anesthesia Plan  ASA: III  Anesthesia Plan:    Post-op Pain Management:    Induction:   PONV Risk Score and Plan:   Airway Management Planned:   Additional Equipment:   Intra-op Plan:   Post-operative Plan:   Informed Consent: I have reviewed the patients History and Physical, chart, labs and discussed the procedure including the risks, benefits and alternatives for the proposed anesthesia with the patient or authorized representative who has indicated his/her understanding and acceptance.       Plan Discussed with: Anesthesiologist and CRNA  Anesthesia Plan Comments:         Anesthesia Quick Evaluation

## 2020-03-22 NOTE — Anesthesia Procedure Notes (Signed)
Epidural Patient location during procedure: OB Start time: 03/22/2020 11:53 AM End time: 03/22/2020 12:19 PM  Staffing Resident/CRNA: Junious Silk, CRNA Performed: resident/CRNA   Preanesthetic Checklist Completed: patient identified, IV checked, site marked, risks and benefits discussed, surgical consent, monitors and equipment checked, pre-op evaluation and timeout performed  Epidural Patient position: sitting Prep: Betadine Patient monitoring: heart rate, continuous pulse ox and blood pressure Approach: midline Location: L3-L4 Injection technique: LOR air  Needle:  Needle type: Tuohy  Needle gauge: 17 G Needle length: 9 cm and 9 Catheter type: closed end flexible Catheter size: 20 Guage Test dose: negative and 1.5% lidocaine with Epi 1:200 K  Assessment Sensory level: T10 Events: blood not aspirated, injection not painful, no injection resistance, no paresthesia and negative IV test  Additional Notes   Patient tolerated the insertion well without complications.Reason for block:procedure for pain

## 2020-03-22 NOTE — Lactation Note (Signed)
This note was copied from a baby's chart. Lactation Consultation Note  Patient Name: Anna Baldwin [id number] Date: 03/22/2020 Reason for consult: Initial assessment;1st time breastfeeding;Term   Maternal Data Formula Feeding for Exclusion: Yes Does the patient have breastfeeding experience prior to this delivery?: Yes Breast fed her last child x 1 yr Feeding Feeding Type: Breast Fed Latches easily to breast LATCH Score Latch: Grasps breast easily, tongue down, lips flanged, rhythmical sucking.  Audible Swallowing: A few with stimulation  Type of Nipple: Everted at rest and after stimulation  Comfort (Breast/Nipple): Soft / non-tender  Hold (Positioning): No assistance needed to correctly position infant at breast.  LATCH Score: 9  Interventions Interventions: Breast feeding basics reviewed;Support pillows  Lactation Tools Discussed/Used WIC Program: No   Consult Status Consult Status: PRN    Dyann Kief 03/22/2020, 2:04 PM

## 2020-03-23 LAB — CBC
HCT: 28.2 % — ABNORMAL LOW (ref 36.0–46.0)
Hemoglobin: 9.5 g/dL — ABNORMAL LOW (ref 12.0–15.0)
MCH: 25.7 pg — ABNORMAL LOW (ref 26.0–34.0)
MCHC: 33.7 g/dL (ref 30.0–36.0)
MCV: 76.4 fL — ABNORMAL LOW (ref 80.0–100.0)
Platelets: 176 10*3/uL (ref 150–400)
RBC: 3.69 MIL/uL — ABNORMAL LOW (ref 3.87–5.11)
RDW: 16.5 % — ABNORMAL HIGH (ref 11.5–15.5)
WBC: 12.8 10*3/uL — ABNORMAL HIGH (ref 4.0–10.5)
nRBC: 0 % (ref 0.0–0.2)

## 2020-03-23 NOTE — Anesthesia Postprocedure Evaluation (Signed)
Anesthesia Post Note  Patient: Anna Baldwin  Procedure(s) Performed: AN AD HOC LABOR EPIDURAL  Patient location during evaluation: Mother Baby Anesthesia Type: Epidural Level of consciousness: awake and alert Pain management: pain level controlled Vital Signs Assessment: post-procedure vital signs reviewed and stable Respiratory status: spontaneous breathing, nonlabored ventilation and respiratory function stable Cardiovascular status: stable Postop Assessment: no headache, no backache, able to ambulate and adequate PO intake Anesthetic complications: no     Last Vitals:  Vitals:   03/23/20 0349 03/23/20 0732  BP: 126/82 126/60  Pulse: 64 68  Resp: 18 18  Temp: 36.7 C 36.6 C  SpO2: 100% 99%    Last Pain:  Vitals:   03/23/20 0732  TempSrc: Oral  PainSc:                  Christia Reading

## 2020-03-23 NOTE — Lactation Note (Signed)
This note was copied from a baby's chart. Lactation Consultation Note  Patient Name: Anna Baldwin [id number] Date: 03/23/2020 Reason for consult: Follow-up assessment;Mother's request;Term(Only been breast feeding for 2 minutes)  Mom was concerned that [name] did not want to stay awake very long at the breast and nipples were already getting a little tender.  Mom has large breasts.  Demonstrated how to hand express and sandwich breast for deep latch.  Assisted mom in comfortable position sitting up in chair with pillow support.  At first she was curling in her lower lip  Once [name] got in comfortable position, she latched with minimal assistance and began strong, rhythmic sucking with occasional swallow with wide flanged lips.  Mom had a tendency to pull back on the breast to get a better view.  Encouraged mom to push down toward her nipple instead of pulling back which seemed to work better for mom.  After a few minutes into the breast feed, mom felt strong cramping which was explained to mom as a good sign that [name] was latching well with good suction.  Occasional breast massage and gentle stimulation helped to keep her nutritively sucking at the breast.  [name] was able to sustain the latch and nursed for 15 minutes on each breast.  She appeared satiated after the breast feed.  Mom has her own nipple butter with coconut oil in it that seems to be helping with the dryness and tenderness.  Mom breast fed her first baby for 3 months.  She said she had problems with engorgement that led into mastitis.  Discussed how to prevent getting engorged or mastitis.  Mom reports the only problem with her now 28 yr old son that she breast fed for a year was getting him to give it up.  Mom has [employer].  She has already received a Medela DEBP through her insurance.  Reviewed feeding cues, normal newborn stomach size, adequate out put, supply and demand, normal course of lactation and routine newborn feeding  patterns.  Lactation community resource hand out given with contact numbers and reviewed and lactation name and number written on white board encouraging mom to call with any questions, concerns or assistance.   Maternal Data Formula Feeding for Exclusion: No Has patient been taught Hand Expression?: Yes Does the patient have breastfeeding experience prior to this delivery?: Yes  Feeding Feeding Type: Breast Fed  LATCH Score Latch: Grasps breast easily, tongue down, lips flanged, rhythmical sucking.  Audible Swallowing: A few with stimulation  Type of Nipple: Everted at rest and after stimulation  Comfort (Breast/Nipple): Filling, red/small blisters or bruises, mild/mod discomfort  Hold (Positioning): Assistance needed to correctly position infant at breast and maintain latch.  LATCH Score: 7  Interventions Interventions: Breast feeding basics reviewed;Assisted with latch;Skin to skin;Breast massage;Hand express;Reverse pressure;Breast compression;Adjust position;Support pillows;Position options;Coconut oil  Lactation Tools Discussed/Used WIC Program: Yes(Also has [employer])   Consult Status Consult Status: Follow-up Date: 03/23/20 Follow-up type: Call as needed    Louis Meckel 03/23/2020, 3:38 PM

## 2020-03-23 NOTE — Progress Notes (Signed)
Post Partum Day # 1, s/p SVD  Subjective: no complaints, up ad lib, voiding and tolerating PO  Objective: Temp:  [97.6 F (36.4 C)-98.6 F (37 C)] 97.9 F (36.6 C) (04/24 0732) Pulse Rate:  [64-91] 68 (04/24 0732) Resp:  [18-20] 18 (04/24 0732) BP: (109-147)/(57-92) 126/60 (04/24 0732) SpO2:  [97 %-100 %] 99 % (04/24 0732)  Physical Exam:  General: alert and no distress  Lungs: clear to auscultation bilaterally Breasts: normal appearance, no masses or tenderness Heart: regular rate and rhythm, S1, S2 normal, no murmur, click, rub or gallop Abdomen: soft, non-tender; bowel sounds normal; no masses,  no organomegaly Pelvis: Lochia: appropriate, Uterine Fundus: firm Extremities: DVT Evaluation: No evidence of DVT seen on physical exam. Negative Homan's sign.  No cords or calf tenderness. No significant calf/ankle edema.  Recent Labs    03/22/20 0554 03/23/20 0634  HGB 10.5* 9.5*  HCT 32.7* 28.2*    Assessment/Plan: Doing well postpartum.  Breastfeeding. Lactation consult  Contraception undecided, considering Vasectomy Mild anemia postpartum, will treat with PO iron outpatient.  D/c home later today or tomorrow.    LOS: 1 day   Hildred Laser, MD [facility] 03/23/2020 9:33 AM

## 2020-03-24 MED ORDER — IBUPROFEN 800 MG PO TABS
800.0000 mg | ORAL_TABLET | Freq: Three times a day (TID) | ORAL | 1 refills | Status: DC | PRN
Start: 2020-03-24 — End: 2021-06-10

## 2020-03-24 NOTE — Discharge Summary (Signed)
OB Discharge Summary     Patient Name: Anna Baldwin DOB: [birth year] MRN: [id number]  Date of admission: 03/22/2020 Delivering MD: Hildred Laser   Date of discharge: 03/24/2020  Admitting diagnosis: Obesity in pregnancy, antepartum, third trimester [O99.213] Intrauterine pregnancy: [redacted]w[redacted]d     Secondary diagnosis:  Active Problems:   Obesity in pregnancy, antepartum, third trimester  Additional problems: AFI increased     Discharge diagnosis: Term Pregnancy Delivered and Anemia                                                                                                Post partum procedures:None  Augmentation: Cytotec  Complications: None  Hospital course:  Induction of Labor With Vaginal Delivery   34 y.o. yo [phone] at [redacted]w[redacted]d was admitted to the hospital 03/22/2020 for induction of labor.  Indication for induction: Morbid obesity in pregnancy (BMI > 50).  Patient had an uncomplicated labor course as follows: Membrane Rupture Time/Date: 10:58 AM ,03/22/2020   Intrapartum Procedures: Episiotomy: None [1]                                         Lacerations:  None [1]  Patient had precipitous delivery of a Viable infant.  Information for the patient's newborn:  Kei, [place] Girl Mckinzi [[id number]]  Delivery Method: Vag-Spont    03/22/2020  Details of delivery can be found in separate delivery note.  Patient had a routine postpartum course. Patient is discharged home 03/24/20.  Physical exam  Vitals:   03/23/20 0732 03/23/20 1649 03/24/20 0047 03/24/20 0908  BP: 126/60 111/71 126/75 119/73  Pulse: 68 83 77 82  Resp: 18 18 20 20   Temp: 97.9 F (36.6 C) 98.3 F (36.8 C) 98.1 F (36.7 C) 98.7 F (37.1 C)  TempSrc: Oral Oral Oral Axillary  SpO2: 99% 100%  97%  Weight:      Height:       General: alert, cooperative and no distress Lochia: appropriate Uterine Fundus: firm Incision: N/A DVT Evaluation: No evidence of DVT seen on physical exam. Negative Homan's sign. No  cords or calf tenderness. No significant calf/ankle edema.   Labs: Lab Results  Component Value Date   WBC 12.8 (H) 03/23/2020   HGB 9.5 (L) 03/23/2020   HCT 28.2 (L) 03/23/2020   MCV 76.4 (L) 03/23/2020   PLT 176 03/23/2020    Edinburgh Postnatal Depression Scale Screening Tool 03/23/2020  I have been able to laugh and see the funny side of things. 0  I have looked forward with enjoyment to things. 0  I have blamed myself unnecessarily when things went wrong. 0  I have been anxious or worried for no good reason. 0  I have felt scared or panicky for no good reason. 0  Things have been getting on top of me. 0  I have been so unhappy that I have had difficulty sleeping. 0  I have felt sad or miserable. 0  I have been so unhappy that I have  been crying. 0  The thought of harming myself has occurred to me. 0  Edinburgh Postnatal Depression Scale Total 0     Discharge instruction: per After Visit Summary. After visit meds:  Allergies as of 03/24/2020   No Known Allergies     Medication List    STOP taking these medications   folic acid 1 MG tablet Commonly known as: FOLVITE   hydrOXYzine 25 MG tablet Commonly known as: ATARAX/VISTARIL     TAKE these medications   ferrous sulfate 325 (65 FE) MG tablet Commonly known as: FerrouSul Take 1 tablet (325 mg total) by mouth daily with breakfast.   ibuprofen 800 MG tablet Commonly known as: ADVIL Take 1 tablet (800 mg total) by mouth every 8 (eight) hours as needed.   OVER THE COUNTER MEDICATION daily. Calm Magnesium take one tablet daily at bedtime to help with sleep.   prenatal multivitamin Tabs tablet Take 1 tablet by mouth daily at 12 noon.   vitamin C 1000 MG tablet Take 1,000 mg by mouth daily.       Diet: routine diet  Activity: Advance as tolerated. Pelvic rest for 6 weeks.   Outpatient follow up:6 weeks Follow up Appt:No future appointments. Follow up Visit:No follow-ups on file.  Postpartum  contraception: Undecided but considering vasectomy  Newborn Data: Live born female  Birth Weight: 7 lb 4.1 oz (3290 g) APGAR: 6, 9  Newborn Delivery   Birth date/time: 03/22/2020 12:46:00 Delivery type: Vaginal, Spontaneous      Baby Feeding: Breast Disposition:home with mother   03/24/2020 Rubie Maid, MD  [facility]

## 2020-03-24 NOTE — Progress Notes (Signed)
Pt discharged with infant. Discharge instructions, prescriptions, and follow up appointments given to and reviewed with patient. Pt verbalized understanding. Escorted out by staff. 

## 2020-04-01 ENCOUNTER — Telehealth: Payer: Self-pay | Admitting: Obstetrics and Gynecology

## 2020-04-01 NOTE — Telephone Encounter (Signed)
Patient called in again to speak with a nurse regarding some swelling she was having. Informed patient that we have 24-48 hours to respond back- informed patient if she developed any worsening symptoms she could also go to the ED. Patient verbalized understanding.

## 2020-04-02 DIAGNOSIS — Z0289 Encounter for other administrative examinations: Secondary | ICD-10-CM

## 2020-04-03 ENCOUNTER — Telehealth: Payer: Self-pay

## 2020-04-03 NOTE — Telephone Encounter (Signed)
Responded in Mychart

## 2020-04-03 NOTE — Telephone Encounter (Signed)
Side of feet are swollen and sore. Pt was informed to elevate her feet when she is sitting, limited the amount of time she is on her feet, use compression socks, massage legs, increase fluid intake and check the blood flow in her legs 3 times a day. Pt stated that she understood.

## 2020-04-03 NOTE — Telephone Encounter (Signed)
Pt called MOnday and sent my chart message yesterday. She would like a return call today.

## 2020-04-12 ENCOUNTER — Telehealth: Payer: Self-pay | Admitting: Obstetrics and Gynecology

## 2020-04-12 NOTE — Telephone Encounter (Signed)
Pt called in and stated that she faxed over a Pregnant claims paperwork. I told the pt that if you faxed it it put in the providers box and they will contact you once its completed.  she said she faxed it over on 5-4

## 2020-04-12 NOTE — Telephone Encounter (Signed)
Do I have her fmla forms or do you have them? Anna Baldwin

## 2020-04-12 NOTE — Telephone Encounter (Signed)
Pt called in and stated that she faxed over a Pregnant claims paperwork. I told the pt that if you faxed it it put in the providers box and they will contact you once its completed.

## 2020-04-15 NOTE — Telephone Encounter (Signed)
I do not have these forms. The front staff has called her back to have her refax the forms.

## 2020-04-22 ENCOUNTER — Telehealth: Payer: Self-pay | Admitting: Obstetrics and Gynecology

## 2020-04-22 NOTE — Telephone Encounter (Signed)
Patient would like to know if its safe for her to get a vaginal wax being 4 weeks PPV. Could you please advise?

## 2020-04-24 NOTE — Telephone Encounter (Signed)
Pt called no answer LM via VM. Pt is aware that I received her call to the office and it is okay for her to do that.

## 2020-04-30 ENCOUNTER — Other Ambulatory Visit: Payer: Self-pay

## 2020-04-30 ENCOUNTER — Encounter: Payer: Self-pay | Admitting: Obstetrics and Gynecology

## 2020-04-30 ENCOUNTER — Ambulatory Visit (INDEPENDENT_AMBULATORY_CARE_PROVIDER_SITE_OTHER): Payer: BC Managed Care – PPO | Admitting: Obstetrics and Gynecology

## 2020-04-30 DIAGNOSIS — Z3009 Encounter for other general counseling and advice on contraception: Secondary | ICD-10-CM

## 2020-04-30 DIAGNOSIS — O9081 Anemia of the puerperium: Secondary | ICD-10-CM

## 2020-04-30 MED ORDER — PHEXXI 1.8-1-0.4 % VA GEL: 1.0000 | VAGINAL | 2 refills | Status: DC | PRN

## 2020-04-30 NOTE — Progress Notes (Signed)
   OBSTETRICS POSTPARTUM CLINIC PROGRESS NOTE  Subjective:     Anna Baldwin is a 34 y.o. [phone] female who presents for a postpartum visit. She is 6 weeks postpartum following a spontaneous vaginal delivery. I have fully reviewed the prenatal and intrapartum course. The delivery was at 39.4 gestational weeks.  Anesthesia: epidural. Postpartum course has been well. Baby's course has been well. Baby is feeding by breast. Bleeding: patient has not resumed menses with No LMP recorded. Bowel function is normal. Bladder function is normal. Patient is sexually active (resumed yesterday, protected with condoms). Contraception method desired is undecided but desires something non-hormonal. Considering diaphragm.  But hopes that her husband will get a vasectomy soon. Postpartum depression screening: negative.  The following portions of the patient's history were reviewed and updated as appropriate: allergies, current medications, past family history, past medical history, past social history, past surgical history and problem list.  Review of Systems Pertinent items noted in HPI and remainder of comprehensive ROS otherwise negative.   Objective:    BP 120/83   Pulse 60   Ht 5\' 3"  (1.6 m)   Wt 269 lb 12.8 oz (122.4 kg)   Breastfeeding Yes   BMI 47.79 kg/m . No LMP recorded.   General:  alert and no distress   Breasts:  inspection negative, no nipple discharge or bleeding, no masses or nodularity palpable  Lungs: clear to auscultation bilaterally  Heart:  regular rate and rhythm, S1, S2 normal, no murmur, click, rub or gallop  Abdomen: soft, non-tender; bowel sounds normal; no masses,  no organomegaly.    Vulva:  normal  Vagina: normal vagina, no discharge, exudate, lesion, or erythema  Cervix:  no cervical motion tenderness and no lesions  Corpus: normal size, contour, position, consistency, mobility, non-tender  Adnexa:  normal adnexa and no mass, fullness, tenderness  Rectal Exam: Not  performed.         Labs:  Lab Results  Component Value Date   HGB 9.5 (L) 03/23/2020     Assessment:   Postpartum care following vaginal delivery Encounter for counseling regarding contraception Postpartum anemia  Plan:    1. Contraception: Discussed non-hormonal options with this patient including condoms, diaphragm, Phexxi (vaginal gel), copper IUD.  Patient will try Phexxi. Also using lactational amenorrhea as method. Is waiting for husband to get vasectomy. 2. Will check Hgb for h/o anemia.  3. Can resume all normal activities.  4. Follow up in: 4-6 months for annual exam   03/25/2020, MD [facility]

## 2020-04-30 NOTE — Progress Notes (Signed)
PT is present today for her postpartum visit. Pt stated that she is breastfeeding and having sexually intercourse recently but used a condom. Pt stated that she is unsure about what form of  birth control she wants. EPDS=0 .  Pt stated that she is doing well no complaints.

## 2020-05-01 LAB — HEMOGLOBIN AND HEMATOCRIT, BLOOD
Hematocrit: 40.6 % (ref 34.0–46.6)
Hemoglobin: 12.3 g/dL (ref 11.1–15.9)

## 2020-11-25 NOTE — Progress Notes (Deleted)
Annual Exam-Pt present for annual exam.  

## 2020-11-26 ENCOUNTER — Encounter: Payer: BC Managed Care – PPO | Admitting: Obstetrics and Gynecology

## 2021-03-23 ENCOUNTER — Encounter: Payer: Self-pay | Admitting: Obstetrics and Gynecology

## 2021-06-10 ENCOUNTER — Encounter: Payer: Self-pay | Admitting: Obstetrics and Gynecology

## 2021-06-10 ENCOUNTER — Other Ambulatory Visit: Payer: Self-pay

## 2021-06-10 ENCOUNTER — Ambulatory Visit (INDEPENDENT_AMBULATORY_CARE_PROVIDER_SITE_OTHER): Payer: Self-pay | Admitting: Obstetrics and Gynecology

## 2021-06-10 ENCOUNTER — Other Ambulatory Visit (HOSPITAL_COMMUNITY)
Admission: RE | Admit: 2021-06-10 | Discharge: 2021-06-10 | Disposition: A | Discharge status: Home / Self Care | Payer: BC Managed Care – PPO | Source: Ambulatory Visit | Attending: Obstetrics and Gynecology | Admitting: Obstetrics and Gynecology

## 2021-06-10 VITALS — BP 111/82 | HR 88 | Ht 63.0 in | Wt 277.4 lb

## 2021-06-10 DIAGNOSIS — Z3049 Encounter for surveillance of other contraceptives: Secondary | ICD-10-CM

## 2021-06-10 DIAGNOSIS — Z01419 Encounter for gynecological examination (general) (routine) without abnormal findings: Secondary | ICD-10-CM | POA: Insufficient documentation

## 2021-06-10 DIAGNOSIS — Z1159 Encounter for screening for other viral diseases: Secondary | ICD-10-CM

## 2021-06-10 DIAGNOSIS — Z124 Encounter for screening for malignant neoplasm of cervix: Secondary | ICD-10-CM

## 2021-06-10 DIAGNOSIS — Z6841 Body Mass Index (BMI) 40.0 and over, adult: Secondary | ICD-10-CM

## 2021-06-10 MED ORDER — PHEXXI 1.8-1-0.4 % VA GEL: 1.0000 | VAGINAL | 11 refills | Status: DC | PRN

## 2021-06-10 NOTE — Patient Instructions (Signed)
Breast Self-Awareness Breast self-awareness is knowing how your breasts look and feel. Doing breast self-awareness is important. It allows you to catch a breast problem early while it is still small and can be treated. All women should do breast self-awareness, including women who have had breast implants. Tell your doctorif you notice a change in your breasts. What you need: A mirror. A well-lit room. How to do a breast self-exam A breast self-exam is one way to learn what is normal for your breasts and tocheck for changes. To do a breast self-exam: Look for changes  Take off all the clothes above your waist. Stand in front of a mirror in a room with good lighting. Put your hands on your hips. Push your hands down. Look at your breasts and nipples in the mirror to see if one breast or nipple looks different from the other. Check to see if: The shape of one breast is different. The size of one breast is different. There are wrinkles, dips, and bumps in one breast and not the other. Look at each breast for changes in the skin, such as: Redness. Scaly areas. Look for changes in your nipples, such as: Liquid around the nipples. Bleeding. Dimpling. Redness. A change in where the nipples are.  Feel for changes  Lie on your back on the floor. Feel each breast. To do this, follow these steps: Pick a breast to feel. Put the arm closest to that breast above your head. Use your other arm to feel the nipple area of your breast. Feel the area with the pads of your three middle fingers by making small circles with your fingers. For the first circle, press lightly. For the second circle, press harder. For the third circle, press even harder. Keep making circles with your fingers at the different pressures as you move down your breast. Stop when you feel your ribs. Move your fingers a little toward the center of your body. Start making circles with your fingers again, this time going up until  you reach your collarbone. Keep making up-and-down circles until you reach your armpit. Remember to keep using the three pressures. Feel the other breast in the same way. Sit or stand in the tub or shower. With soapy water on your skin, feel each breast the same way you did in step 2 when you were lying on the floor.  Write down what you find Writing down what you find can help you remember what to tell your doctor. Write down: What is normal for each breast. Any changes you find in each breast, including: The kind of changes you find. Whether you have pain. Size and location of any lumps. When you last had your menstrual period. General tips Check your breasts every month. If you are breastfeeding, the best time to check your breasts is after you feed your baby or after you use a breast pump. If you get menstrual periods, the best time to check your breasts is 5-7 days after your menstrual period is over. With time, you will become comfortable with the self-exam, and you will begin to know if there are changes in your breasts. Contact a doctor if you: See a change in the shape or size of your breasts or nipples. See a change in the skin of your breast or nipples, such as red or scaly skin. Have fluid coming from your nipples that is not normal. Find a lump or thick area that was not there before. Have pain in   your breasts. Have any concerns about your breast health. Summary Breast self-awareness includes looking for changes in your breasts, as well as feeling for changes within your breasts. Breast self-awareness should be done in front of a mirror in a well-lit room. You should check your breasts every month. If you get menstrual periods, the best time to check your breasts is 5-7 days after your menstrual period is over. Let your doctor know of any changes you see in your breasts, including changes in size, changes on the skin, pain or tenderness, or fluid from your nipples that is  not normal. This information is not intended to replace advice given to you by your health care provider. Make sure you discuss any questions you have with your healthcare provider. Document Revised: 07/05/2018 Document Reviewed: 07/05/2018 Elsevier Patient Education  2022 [employer] Preventive Care 21-39 Years Old, Female Preventive care refers to lifestyle choices and visits with your health care provider that can promote health and wellness. This includes: A yearly physical exam. This is also called an annual wellness visit. Regular dental and eye exams. Immunizations. Screening for certain conditions. Healthy lifestyle choices, such as: Eating a healthy diet. Getting regular exercise. Not using drugs or products that contain nicotine and tobacco. Limiting alcohol use. What can I expect for my preventive care visit? Physical exam Your health care provider may check your: Height and weight. These may be used to calculate your BMI (body mass index). BMI is a measurement that tells if you are at a healthy weight. Heart rate and blood pressure. Body temperature. Skin for abnormal spots. Counseling Your health care provider may ask you questions about your: Past medical problems. Family's medical history. Alcohol, tobacco, and drug use. Emotional well-being. Home life and relationship well-being. Sexual activity. Diet, exercise, and sleep habits. Work and work environment. Access to firearms. Method of birth control. Menstrual cycle. Pregnancy history. What immunizations do I need?  Vaccines are usually given at various ages, according to a schedule. Your health care provider will recommend vaccines for you based on your age, medicalhistory, and lifestyle or other factors, such as travel or where you work. What tests do I need?  Blood tests Lipid and cholesterol levels. These may be checked every 5 years starting at age 20. Hepatitis C test. Hepatitis B  test. Screening Diabetes screening. This is done by checking your blood sugar (glucose) after you have not eaten for a while (fasting). STD (sexually transmitted disease) testing, if you are at risk. BRCA-related cancer screening. This may be done if you have a family history of breast, ovarian, tubal, or peritoneal cancers. Pelvic exam and Pap test. This may be done every 3 years starting at age 21. Starting at age 30, this may be done every 5 years if you have a Pap test in combination with an HPV test. Talk with your health care provider about your test results, treatment options,and if necessary, the need for more tests. Follow these instructions at home: Eating and drinking  Eat a healthy diet that includes fresh fruits and vegetables, whole grains, lean protein, and low-fat dairy products. Take vitamin and mineral supplements as recommended by your health care provider. Do not drink alcohol if: Your health care provider tells you not to drink. You are pregnant, may be pregnant, or are planning to become pregnant. If you drink alcohol: Limit how much you have to 0-1 drink a day. Be aware of how much alcohol is in your drink. In the [place] one   drink equals one 12 oz bottle of beer (355 mL), one 5 oz glass of wine (148 mL), or one 1 oz glass of hard liquor (44 mL).  Lifestyle Take daily care of your teeth and gums. Brush your teeth every morning and night with fluoride toothpaste. Floss one time each day. Stay active. Exercise for at least 30 minutes 5 or more days each week. Do not use any products that contain nicotine or tobacco, such as cigarettes, e-cigarettes, and chewing tobacco. If you need help quitting, ask your health care provider. Do not use drugs. If you are sexually active, practice safe sex. Use a condom or other form of protection to prevent STIs (sexually transmitted infections). If you do not wish to become pregnant, use a form of birth control. If you plan to become  pregnant, see your health care provider for a prepregnancy visit. Find healthy ways to cope with stress, such as: Meditation, yoga, or listening to music. Journaling. Talking to a trusted person. Spending time with friends and family. Safety Always wear your seat belt while driving or riding in a vehicle. Do not drive: If you have been drinking alcohol. Do not ride with someone who has been drinking. When you are tired or distracted. While texting. Wear a helmet and other protective equipment during sports activities. If you have firearms in your house, make sure you follow all gun safety procedures. Seek help if you have been physically or sexually abused. What's next? Go to your health care provider once a year for an annual wellness visit. Ask your health care provider how often you should have your eyes and teeth checked. Stay up to date on all vaccines. This information is not intended to replace advice given to you by your health care provider. Make sure you discuss any questions you have with your healthcare provider. Document Revised: 07/14/2020 Document Reviewed: 07/28/2018 Elsevier Patient Education  2022 Elsevier Inc.  

## 2021-06-10 NOTE — Progress Notes (Signed)
GYNECOLOGY ANNUAL PHYSICAL EXAM PROGRESS NOTE  Subjective:    Anna Baldwin is a 35 y.o. [phone] married female who presents for an annual exam. The patient has no complaints today. The patient is sexually active. The patient wears seatbelts: yes. The patient participates in regular exercise: yes (4 x weekly). Has the patient ever been transfused or tattooed?: yes. The patient reports that there is not domestic violence in her life.   Anna Baldwin desires to mention the following today:  Desires to discuss birth control. Is working on husband getting a vasectomy but needs something in interim.  Notes she has lost some weight recently, intentional.  Has lost 15 lbs. Just began working out last month. Recently has stopped breastfeeding.    Menstrual History: Menarche age: 13 Patient's last menstrual period was 05/21/2021 (exact date). Period Duration (Days): 4 Period Pattern: Regular Menstrual Flow: Moderate, Light, Heavy Menstrual Control: Maxi pad Menstrual Control Change Freq (Hours): 2-6 Dysmenorrhea: (!) Mild Dysmenorrhea Symptoms: Cramping, Headache, Diarrhea  Gynecologic History: Contraception: none History of STI's: Denies Last Pap: 07/15/2018. Results were: normal.  Denies h/o abnormal pap smears.    Upstream - 06/10/21 1419       Pregnancy Intention Screening   Does the patient want to become pregnant in the next year? No    Does the patient's partner want to become pregnant in the next year? No    Would the patient like to discuss contraceptive options today? Yes      Contraception Wrap Up   Current Method No Method - Other Reason    End Method Spermicide (Used Alone)            The pregnancy intention screening data noted above was reviewed. Potential methods of contraception were discussed. The patient elected to proceed with Spermicide (Used Alone).    OB History  Gravida Para Term Preterm AB Living  4 3 3  0 1 3  SAB IAB Ectopic Multiple Live  Births  1 0 0 0 3    # Outcome Date GA Lbr Len/2nd Weight Sex Delivery Anes PTL Lv  4 Term 03/22/20 [redacted]w[redacted]d 01:48 7 lb 4.1 oz (3.29 kg) F Vag-Spont EPI  LIV     Name: ALOHA, BARTOK     Apgar1: 6  Apgar5: 9  3 SAB 06/2018          2 Term 10/26/12 [redacted]w[redacted]d  6 lb 12 oz (3.062 kg) M Vag-Spont   LIV  1 Term 10/10/07 [redacted]w[redacted]d  7 lb 6 oz (3.345 kg) F Vag-Spont   LIV    Past Medical History:  Diagnosis Date   Family history of breast cancer    Family history of ovarian cancer    Medical history non-contributory     Past Surgical History:  Procedure Laterality Date   NO PAST SURGERIES      Family History  Problem Relation Age of Onset   Breast cancer Maternal Aunt 30   Ovarian cancer Maternal Grandmother 60   Breast cancer Maternal Aunt 30   Breast cancer Maternal Aunt 30   Hyperlipidemia Mother    Diabetes Father     Social History   Socioeconomic History   Marital status: Married    Spouse name: Not on file   Number of children: Not on file   Years of education: Not on file   Highest education level: Not on file  Occupational History   Not on file  Tobacco Use   Smoking status: Never  Smokeless tobacco: Never  Vaping Use   Vaping Use: Never used  Substance and Sexual Activity   Alcohol use: Not Currently   Drug use: Never   Sexual activity: Yes    Birth control/protection: None  Other Topics Concern   Not on file  Social History Narrative   Not on file   Social Determinants of Health   Financial Resource Strain: Not on file  Food Insecurity: Not on file  Transportation Needs: Not on file  Physical Activity: Not on file  Stress: Not on file  Social Connections: Not on file  Intimate Partner Violence: Not on file    No current outpatient medications on file prior to visit.   No current facility-administered medications on file prior to visit.    No Known Allergies    Review of Systems Constitutional: negative for chills, fatigue, fevers and  sweats Eyes: negative for irritation, redness and visual disturbance Ears, nose, mouth, throat, and face: negative for hearing loss, nasal congestion, snoring and tinnitus Respiratory: negative for asthma, cough, sputum Cardiovascular: negative for chest pain, dyspnea, exertional chest pressure/discomfort, irregular heart beat, palpitations and syncope Gastrointestinal: negative for abdominal pain, change in bowel habits, nausea and vomiting Genitourinary: negative for abnormal menstrual periods, genital lesions, sexual problems and vaginal discharge, dysuria and urinary incontinence Integument/breast: negative for breast lump, breast tenderness and nipple discharge Hematologic/lymphatic: negative for bleeding and easy bruising Musculoskeletal:negative for back pain and muscle weakness Neurological: negative for dizziness, headaches, vertigo and weakness Endocrine: negative for diabetic symptoms including polydipsia, polyuria and skin dryness Allergic/Immunologic: negative for hay fever and urticaria      Objective:  Blood pressure 111/82, pulse 88, height 5\' 3"  (1.6 m), weight 277 lb 6.4 oz (125.8 kg), last menstrual period 05/21/2021, currently breastfeeding. Body mass index is 49.14 kg/m.   General Appearance:    Alert, cooperative, no distress, appears stated age, morbid obesity.   Head:    Normocephalic, without obvious abnormality, atraumatic  Eyes:    PERRL, conjunctiva/corneas clear, EOM's intact, both eyes  Ears:    Normal external ear canals, both ears  Nose:   Nares normal, septum midline, mucosa normal, no drainage or sinus tenderness  Throat:   Lips, mucosa, and tongue normal; teeth and gums normal  Neck:   Supple, symmetrical, trachea midline, no adenopathy; thyroid: no enlargement/tenderness/nodules; no carotid bruit or JVD  Back:     Symmetric, no curvature, ROM normal, no CVA tenderness  Lungs:     Clear to auscultation bilaterally, respirations unlabored  Chest Wall:     No tenderness or deformity   Heart:    Regular rate and rhythm, S1 and S2 normal, no murmur, rub or gallop  Breast Exam:    No tenderness, masses, or nipple abnormality  Abdomen:     Soft, non-tender, bowel sounds active all four quadrants, no masses, no organomegaly.    Genitalia:    Pelvic:external genitalia normal, vagina without lesions, discharge, or tenderness, rectovaginal septum  normal. Cervix normal in appearance, no cervical motion tenderness, no adnexal masses or tenderness.  Uterus normal size, shape, mobile, regular contours, nontender.  Rectal:    Normal external sphincter.  No hemorrhoids appreciated. Internal exam not done.   Extremities:   Extremities normal, atraumatic, no cyanosis or edema  Pulses:   2+ and symmetric all extremities  Skin:   Skin color, texture, turgor normal, no rashes or lesions  Lymph nodes:   Cervical, supraclavicular, and axillary nodes normal  Neurologic:   CNII-XII  intact, normal strength, sensation and reflexes throughout   .  Labs:  Lab Results  Component Value Date   WBC 12.8 (H) 03/23/2020   HGB 12.3 04/30/2020   HCT 40.6 04/30/2020   MCV 76.4 (L) 03/23/2020   PLT 176 03/23/2020    Lab Results  Component Value Date   CREATININE 0.77 10/14/2018   BUN 10 10/14/2018   NA 140 10/14/2018   K 4.6 10/14/2018   CL 102 10/14/2018   CO2 25 10/14/2018    Lab Results  Component Value Date   ALT 19 05/28/2014   AST 39 (H) 05/28/2014   ALKPHOS 97 05/28/2014   BILITOT 0.2 05/28/2014    Lab Results  Component Value Date   TSH 2.780 10/14/2018     Assessment:   1. Encounter for well woman exam with routine gynecological exam   2. Pap smear for cervical cancer screening   3. Class 3 severe obesity without serious comorbidity with body mass index (BMI) of 45.0 to 49.9 in adult, unspecified obesity type (HCC)   4. Encounter for surveillance of other contraceptive     Plan:     Blood tests: CBC with diff, Comprehensive metabolic  panel, Lipoproteins, TSH, and Hemoglobin A1c. Breast self exam technique reviewed and patient encouraged to perform self-exam monthly. Contraception: Patient desires to resume Phexxi vaginal inserts until husband schedules vasectomy. Will prescribe. Discussed healthy lifestyle modifications. Pap smear.  COVID vaccination status: declined vaccination.   Hildred Laser, MD [facility]

## 2021-06-10 NOTE — Progress Notes (Signed)
Pt present for annual exam. Pt stated that she was doing well. Pt would like to discuss birth control and unsure of what form. Pt's husband is thinking about getting a vasectomy.

## 2021-06-17 LAB — CYTOLOGY - PAP
Adequacy: ABSENT
Comment: NEGATIVE
Diagnosis: NEGATIVE
High risk HPV: NEGATIVE

## 2021-12-13 ENCOUNTER — Other Ambulatory Visit: Payer: Self-pay

## 2021-12-13 ENCOUNTER — Ambulatory Visit
Admission: EM | Admit: 2021-12-13 | Discharge: 2021-12-13 | Disposition: A | Discharge status: Home / Self Care | Payer: BC Managed Care – PPO | Attending: Emergency Medicine | Admitting: Emergency Medicine

## 2021-12-13 ENCOUNTER — Ambulatory Visit (INDEPENDENT_AMBULATORY_CARE_PROVIDER_SITE_OTHER): Payer: BC Managed Care – PPO

## 2021-12-13 DIAGNOSIS — M722 Plantar fascial fibromatosis: Secondary | ICD-10-CM | POA: Diagnosis not present

## 2021-12-13 DIAGNOSIS — M25572 Pain in left ankle and joints of left foot: Secondary | ICD-10-CM

## 2021-12-13 DIAGNOSIS — M79672 Pain in left foot: Secondary | ICD-10-CM

## 2021-12-13 MED ORDER — METHYLPREDNISOLONE 4 MG PO TBPK: ORAL_TABLET | ORAL | 0 refills | Status: DC

## 2021-12-13 NOTE — ED Triage Notes (Signed)
Patient is here for "Foot Pain". (Left). "Problem with feet for years". Last 3 days "hurting more than usual". Seems to be at ankle and Foot. No injury known.

## 2021-12-13 NOTE — Discharge Instructions (Signed)
Take the Medrol dose pack as directed starting tomorrow morning.  Ice you foot for 20 minutes at a time, 2-3 times a day.  Wear supportive foot wear with good arch support.  Consult a specialist about supportive inserts  If your symptoms do improve you should follow-up with podiatry.Marland Kitchen

## 2021-12-13 NOTE — ED Provider Notes (Signed)
[facility]    CSN: [id number] Arrival date & time: 12/13/21  1138      History   Chief Complaint Chief Complaint  Patient presents with   Foot Pain    HPI Anna Baldwin is a 36 y.o. female.   HPI  35 year old female here for evaluation of left foot pain.  Patient reports that she has been experiencing pain in her feet for "years" but the last 3 days the pain has increased and spread to more than usual.  She states the pain is behind the ball of her foot through her arch and up the medial aspect of her foot.  She denies any injury, numbness, or tingling.  She has never had this evaluated.  Past Medical History:  Diagnosis Date   Family history of breast cancer    Family history of ovarian cancer    Medical history non-contributory     Patient Active Problem List   Diagnosis Date Noted   Obesity in pregnancy, antepartum, third trimester 03/22/2020   Abdominal pain affecting pregnancy 03/02/2020   Abdominal pain in pregnancy, third trimester 03/02/2020   Pregnancy 03/01/2020   AFI (amniotic fluid index) increased 02/07/2020   Indication for care in labor or delivery 01/21/2020    Past Surgical History:  Procedure Laterality Date   NO PAST SURGERIES      OB History     Gravida  4   Para  3   Term  3   Preterm      AB  1   Living  3      SAB  1   IAB      Ectopic      Multiple  0   Live Births  3            Home Medications    Prior to Admission medications   Medication Sig Start Date End Date Taking? Authorizing Provider  methylPREDNISolone (MEDROL DOSEPAK) 4 MG TBPK tablet Take according to the package insert. 12/13/21  Yes Becky Augusta, NP  Lactic Ac-Citric Ac-Pot Bitart (PHEXXI) 1.8-1-0.4 % GEL Place 1 Applicatorful vaginally as needed. 06/10/21   Hildred Laser, MD    Family History Family History  Problem Relation Age of Onset   Breast cancer Maternal Aunt 30   Ovarian cancer Maternal Grandmother 29   Breast  cancer Maternal Aunt 30   Breast cancer Maternal Aunt 30   Hyperlipidemia Mother    Diabetes Father     Social History Social History   Tobacco Use   Smoking status: Never   Smokeless tobacco: Never  Vaping Use   Vaping Use: Never used  Substance Use Topics   Alcohol use: Not Currently   Drug use: Never     Allergies   Patient has no known allergies.   Review of Systems Review of Systems  Constitutional:  Negative for activity change, appetite change and fever.  Musculoskeletal:  Positive for myalgias.  Skin:  Negative for color change.  Neurological:  Negative for weakness and numbness.  Hematological: Negative.   Psychiatric/Behavioral: Negative.      Physical Exam Triage Vital Signs ED Triage Vitals  Enc Vitals Group     BP 12/13/21 1151 125/63     Pulse Rate 12/13/21 1151 62     Resp 12/13/21 1151 18     Temp 12/13/21 1151 98.1 F (36.7 C)     Temp Source 12/13/21 1151 Oral     SpO2 12/13/21 1151 99 %  Weight 12/13/21 1147 252 lb (114.3 kg)     Height 12/13/21 1147 5\' 3"  (1.6 m)     Head Circumference --      Peak Flow --      Pain Score 12/13/21 1147 10     Pain Loc --      Pain Edu? --      Excl. in [place]? --    No data found.  Updated Vital Signs BP 125/63 (BP Location: Left Arm)    Pulse 62    Temp 98.1 F (36.7 C) (Oral)    Resp 18    Ht 5\' 3"  (1.6 m)    Wt 252 lb (114.3 kg)    LMP 11/21/2021 (Exact Date)    SpO2 99%    BMI 44.64 kg/m   Visual Acuity Right Eye Distance:   Left Eye Distance:   Bilateral Distance:    Right Eye Near:   Left Eye Near:    Bilateral Near:     Physical Exam Vitals and nursing note reviewed.  Constitutional:      General: She is not in acute distress.    Appearance: Normal appearance. She is not ill-appearing.  HENT:     Head: Normocephalic and atraumatic.  Musculoskeletal:        General: Tenderness present. No swelling or deformity. Normal range of motion.  Skin:    General: Skin is warm and dry.      Capillary Refill: Capillary refill takes less than 2 seconds.     Findings: No bruising.  Neurological:     General: No focal deficit present.     Mental Status: She is alert and oriented to person, place, and time.  Psychiatric:        Mood and Affect: Mood normal.        Behavior: Behavior normal.        Thought Content: Thought content normal.        Judgment: Judgment normal.     UC Treatments / Results  Labs (all labs ordered are listed, but only abnormal results are displayed) Labs Reviewed - No data to display  EKG   Radiology DG Ankle Complete Left  Result Date: 12/13/2021 CLINICAL DATA:  Foot/ankle pain for years, worse over the last 3 days. EXAM: LEFT ANKLE COMPLETE - 3+ VIEW COMPARISON:  None. FINDINGS: No fracture or bone lesion. Ankle joint normally spaced and aligned.  No arthropathic changes. Small plantar calcaneal spur. Mild nonspecific soft tissue swelling. IMPRESSION: 1. No fracture or joint abnormality. 2. Small plantar calcaneal spur. Electronically Signed   By: 11/23/2021 M.D.   On: 12/13/2021 12:34   DG Foot Complete Left  Result Date: 12/13/2021 CLINICAL DATA:  Left foot pain for years. More pain over the last 3 days. EXAM: LEFT FOOT - COMPLETE 3+ VIEW COMPARISON:  None. FINDINGS: No fracture or bone lesion. Joints are normally spaced and aligned.  No arthropathic changes. Small plantar calcaneal spur. Mild forefoot soft tissue swelling. IMPRESSION: 1. No fracture or joint abnormality. 2. Small plantar calcaneal spur. Electronically Signed   By: 12/15/2021 M.D.   On: 12/13/2021 12:33    Procedures Procedures (including critical care time)  Medications Ordered in UC Medications - No data to display  Initial Impression / Assessment and Plan / UC Course  I have reviewed the triage vital signs and the nursing notes.  Pertinent labs & imaging results that were available during my care of the patient were reviewed by me  and considered in my medical  decision making (see chart for details).  Patient is a nontoxic-appearing 36 year old female here for evaluation of left foot pain as outlined in HPI above.  Patient reports that she has had pain in her feet for years that is worsened over the last 3 days.  She states that the pain is behind the ball of her foot and extends back through her arch and wraps around the medial aspect of her foot.  There is no pain on the top of her foot, numbness tingling in her toes, pain in her calcaneus, or pain in her Achilles.  No pain with compression of the medial or lateral malleolus.  Patient states that she wears flat shoes all the time but she denies any injury.  X-rays left foot and ankle obtained at triage.  Left foot x-rays independently viewed and evaluated by me.  Impression: There is a small calcaneal spur but images are otherwise unremarkable.  Overread is pending. Radiology impression is negative for fracture or joint abnormality.  Small plantar calcaneal spur.  Left ankle films independently reviewed and evaluated by me.  Impression: No evidence of fracture or dislocation.  Radiology overread is pending. Radiology impression is no fracture or joint abnormality.  Patient exam is most consistent with plantar fasciitis, most likely secondary to wearing flat shoes all the time.  Patient also has a significant BMI of 44.6.  I have advised the patient that she needs to wear more supportive footwear, ice or arches with either a cold [place] or frozen water bottle for 20 minutes at a time 2-3 times a day, take over-the-counter NSAIDs, and I have suggested that she follow-up with specialist for evaluation of possible need for shoe inserts to help with her foot pain.  Will give patient a Medrol Dosepak to help with her acute pain that she can start tomorrow.   Final Clinical Impressions(s) / UC Diagnoses   Final diagnoses:  Plantar fasciitis of left foot     Discharge Instructions      Take the Medrol  dose pack as directed starting tomorrow morning.  Ice you foot for 20 minutes at a time, 2-3 times a day.  Wear supportive foot wear with good arch support.  Consult a specialist about supportive inserts  If your symptoms do improve you should follow-up with podiatry..     ED Prescriptions     Medication Sig Dispense Auth. Provider   methylPREDNISolone (MEDROL DOSEPAK) 4 MG TBPK tablet Take according to the package insert. 1 each Becky Augustayan, Teyana Pierron, NP      PDMP not reviewed this encounter.   Becky Augustayan, Gorge Almanza, NP 12/13/21 1401

## 2021-12-27 ENCOUNTER — Other Ambulatory Visit: Payer: Self-pay

## 2021-12-27 ENCOUNTER — Ambulatory Visit
Admission: EM | Admit: 2021-12-27 | Discharge: 2021-12-27 | Disposition: A | Discharge status: Home / Self Care | Payer: BC Managed Care – PPO | Attending: Family Medicine | Admitting: Family Medicine

## 2021-12-27 ENCOUNTER — Encounter: Payer: Self-pay | Admitting: Emergency Medicine

## 2021-12-27 DIAGNOSIS — N760 Acute vaginitis: Secondary | ICD-10-CM | POA: Insufficient documentation

## 2021-12-27 DIAGNOSIS — R300 Dysuria: Secondary | ICD-10-CM | POA: Diagnosis not present

## 2021-12-27 DIAGNOSIS — B9689 Other specified bacterial agents as the cause of diseases classified elsewhere: Secondary | ICD-10-CM | POA: Diagnosis not present

## 2021-12-27 DIAGNOSIS — B3731 Acute candidiasis of vulva and vagina: Secondary | ICD-10-CM | POA: Diagnosis not present

## 2021-12-27 LAB — URINALYSIS, ROUTINE W REFLEX MICROSCOPIC
Bilirubin Urine: NEGATIVE
Glucose, UA: NEGATIVE mg/dL
Ketones, ur: NEGATIVE mg/dL
Nitrite: NEGATIVE
Protein, ur: NEGATIVE mg/dL
Specific Gravity, Urine: 1.015 (ref 1.005–1.030)
pH: 7 (ref 5.0–8.0)

## 2021-12-27 LAB — WET PREP, GENITAL
Sperm: NONE SEEN
Trich, Wet Prep: NONE SEEN
WBC, Wet Prep HPF POC: <10 — AB (ref <10)
Yeast Wet Prep HPF POC: NONE SEEN

## 2021-12-27 LAB — URINALYSIS, MICROSCOPIC (REFLEX): WBC, UA: >50 WBC/hpf (ref 0–5)

## 2021-12-27 LAB — PREGNANCY, URINE: Preg Test, Ur: NEGATIVE

## 2021-12-27 MED ORDER — FLUCONAZOLE 200 MG PO TABS: 200.0000 mg | ORAL_TABLET | Freq: Every day | ORAL | 1 refills | Status: AC

## 2021-12-27 MED ORDER — PHENAZOPYRIDINE HCL 200 MG PO TABS: 200.0000 mg | ORAL_TABLET | Freq: Three times a day (TID) | ORAL | 0 refills | Status: DC

## 2021-12-27 MED ORDER — METRONIDAZOLE 500 MG PO TABS: 500.0000 mg | ORAL_TABLET | Freq: Two times a day (BID) | ORAL | 0 refills | Status: DC

## 2021-12-27 NOTE — ED Triage Notes (Signed)
Pt c/o urinary frequency and dysuria and back pain x 1 week. Pt states she has a hx of frequent UTIs in her 39s.

## 2021-12-27 NOTE — ED Provider Notes (Signed)
[facility]    CSN: [id number] Arrival date & time: 12/27/21  0836      History   Chief Complaint Chief Complaint  Patient presents with   Urinary Tract Infection    HPI Anna Baldwin is a 36 y.o. female.   HPI  36 year old female here for evaluation of urinary complaints.  Patient reports that she has been experiencing painful urination with urinary urgency and frequency for the last week.  She reports that she has had a fever with a T-max of 100.2 yesterday.  She is endorsing suprapubic abdominal pain, low back pain, and a light green vaginal discharge.  She is sexually active but denies any chance of STIs as she is married in a monogamous relationship.  She denies any nausea, vomiting, diarrhea, vaginal bleeding, vaginal itching, or blood in her urine.  Past Medical History:  Diagnosis Date   Family history of breast cancer    Family history of ovarian cancer    Medical history non-contributory     Patient Active Problem List   Diagnosis Date Noted   Obesity in pregnancy, antepartum, third trimester 03/22/2020   Abdominal pain affecting pregnancy 03/02/2020   Abdominal pain in pregnancy, third trimester 03/02/2020   Pregnancy 03/01/2020   AFI (amniotic fluid index) increased 02/07/2020   Indication for care in labor or delivery 01/21/2020    Past Surgical History:  Procedure Laterality Date   NO PAST SURGERIES      OB History     Gravida  4   Para  3   Term  3   Preterm      AB  1   Living  3      SAB  1   IAB      Ectopic      Multiple  0   Live Births  3            Home Medications    Prior to Admission medications   Medication Sig Start Date End Date Taking? Authorizing Provider  fluconazole (DIFLUCAN) 200 MG tablet Take 1 tablet (200 mg total) by mouth daily for 2 doses. 12/27/21 12/29/21 Yes Margarette Canada, NP  metroNIDAZOLE (FLAGYL) 500 MG tablet Take 1 tablet (500 mg total) by mouth 2 (two) times daily. 12/27/21   Yes Margarette Canada, NP  phenazopyridine (PYRIDIUM) 200 MG tablet Take 1 tablet (200 mg total) by mouth 3 (three) times daily. 12/27/21  Yes Margarette Canada, NP    Family History Family History  Problem Relation Age of Onset   Breast cancer Maternal Aunt 69   Ovarian cancer Maternal Grandmother 62   Breast cancer Maternal Aunt 30   Breast cancer Maternal Aunt 66   Hyperlipidemia Mother    Diabetes Father     Social History Social History   Tobacco Use   Smoking status: Never   Smokeless tobacco: Never  Vaping Use   Vaping Use: Never used  Substance Use Topics   Alcohol use: Not Currently   Drug use: Never     Allergies   Patient has no known allergies.   Review of Systems Review of Systems  Constitutional:  Positive for fever.  Gastrointestinal:  Positive for abdominal pain. Negative for diarrhea, nausea and vomiting.  Genitourinary:  Positive for dysuria, frequency and urgency. Negative for hematuria.  Musculoskeletal:  Positive for back pain.  Skin:  Negative for rash.  Hematological: Negative.   Psychiatric/Behavioral: Negative.      Physical Exam Triage Vital Signs  ED Triage Vitals  Enc Vitals Group     BP 12/27/21 0849 117/82     Pulse Rate 12/27/21 0849 71     Resp --      Temp 12/27/21 0849 98.3 F (36.8 C)     Temp Source 12/27/21 0849 Oral     SpO2 12/27/21 0849 100 %     Weight --      Height --      Head Circumference --      Peak Flow --      Pain Score 12/27/21 0845 10     Pain Loc --      Pain Edu? --      Excl. in [place]? --    No data found.  Updated Vital Signs BP 117/82 (BP Location: Right Arm)    Pulse 71    Temp 98.3 F (36.8 C) (Oral)    LMP 11/22/2021    SpO2 100%   Visual Acuity Right Eye Distance:   Left Eye Distance:   Bilateral Distance:    Right Eye Near:   Left Eye Near:    Bilateral Near:     Physical Exam Vitals and nursing note reviewed.  Constitutional:      General: She is not in acute distress.    Appearance:  Normal appearance. She is not ill-appearing.  HENT:     Head: Normocephalic and atraumatic.     Right Ear: Tympanic membrane, ear canal and external ear normal. There is no impacted cerumen.     Left Ear: Tympanic membrane, ear canal and external ear normal. There is no impacted cerumen.  Cardiovascular:     Rate and Rhythm: Normal rate and regular rhythm.     Pulses: Normal pulses.     Heart sounds: Normal heart sounds. No murmur heard.   No friction rub. No gallop.  Pulmonary:     Effort: Pulmonary effort is normal.     Breath sounds: Normal breath sounds. No wheezing, rhonchi or rales.  Abdominal:     Palpations: Abdomen is soft.     Tenderness: There is abdominal tenderness. There is no right CVA tenderness, left CVA tenderness, guarding or rebound.  Musculoskeletal:     Cervical back: Normal range of motion and neck supple.  Lymphadenopathy:     Cervical: No cervical adenopathy.  Skin:    General: Skin is warm and dry.     Capillary Refill: Capillary refill takes less than 2 seconds.     Findings: No erythema or rash.  Neurological:     General: No focal deficit present.     Mental Status: She is alert and oriented to person, place, and time.  Psychiatric:        Mood and Affect: Mood normal.        Behavior: Behavior normal.        Thought Content: Thought content normal.        Judgment: Judgment normal.     UC Treatments / Results  Labs (all labs ordered are listed, but only abnormal results are displayed) Labs Reviewed  WET PREP, GENITAL - Abnormal; Notable for the following components:      Result Value   Clue Cells Wet Prep HPF POC PRESENT (*)    WBC, Wet Prep HPF POC <10 (*)    All other components within normal limits  URINALYSIS, ROUTINE W REFLEX MICROSCOPIC - Abnormal; Notable for the following components:   Hgb urine dipstick TRACE (*)    Leukocytes,Ua  SMALL (*)    All other components within normal limits  URINALYSIS, MICROSCOPIC (REFLEX) - Abnormal;  Notable for the following components:   Bacteria, UA FEW (*)    Non Squamous Epithelial PRESENT (*)    All other components within normal limits  URINE CULTURE  PREGNANCY, URINE    EKG   Radiology No results found.  Procedures Procedures (including critical care time)  Medications Ordered in UC Medications - No data to display  Initial Impression / Assessment and Plan / UC Course  I have reviewed the triage vital signs and the nursing notes.  Pertinent labs & imaging results that were available during my care of the patient were reviewed by me and considered in my medical decision making (see chart for details).  Patient is a nontoxic-appearing 36 year old female here for evaluation of urinary complaints outlined HPI above.  She also notes that she is having a green vaginal discharge.  She denies any vaginal itching or vaginal pain.  No hematuria.  On exam patient is a benign cardiopulmonary exam with clear lung sounds in all fields.  No CVA tenderness on exam.  Patient's abdomen is protuberant but soft with mild suprapubic pain.  No guarding or rebound.  Urinalysis was collected at triage.  Patient's last menstrual cycle was 11/23/21 and she is 3 days late on her menses so I will order a urine pregnancy test.  I will also order a wet prep as she is complaining of a green vaginal discharge.  She denies any possibility of STIs as she is in a monogamous relationship and is married.  Urinalysis shows small leukocytes hemoglobin.  No nitrites, protein, or ketones.  The reflex microscopic shows few bacteria, greater than 50 WBCs, and squamous epithelials, and budding yeast.  Wet prep shows clue cells present.  Pregnancy test is negative.  Will send urine for culture.  I suspect that the vaginal yeast is coming from the vaginal yeast infection and the non-squamous epithelials are from the bacterial vaginosis.  We will treat patient for bacterial vaginosis given the positive clue cells on  the wet prep and yeast vaginitis with metronidazole and Diflucan.  I will prescribe Pyridium to help with urinary discomfort.   Final Clinical Impressions(s) / UC Diagnoses   Final diagnoses:  Dysuria  BV (bacterial vaginosis)  Vaginal yeast infection     Discharge Instructions      Take the Flagyl (metronidazole) 500 mg twice daily for treatment of your bacterial vaginosis.  Avoid alcohol while on the metronidazole as taken together will cause of vomiting.  Bacterial vaginosis is often caused by a imbalance of bacteria in your vaginal vault.  This is sometimes a result of using tampons or hormonal fluctuations during her menstrual cycle.  You if your symptoms are recurrent you can try using a boric acid suppository twice weekly to help maintain the acid-base balance in your vagina vault which could prevent further infection.  You can also try vaginal probiotics to help return normal bacterial balance.   Take the Diflucan now and repeat in 1 week if you have continued symptoms for treatment of your yeast infection.     ED Prescriptions     Medication Sig Dispense Auth. Provider   metroNIDAZOLE (FLAGYL) 500 MG tablet Take 1 tablet (500 mg total) by mouth 2 (two) times daily. 14 tablet Margarette Canada, NP   fluconazole (DIFLUCAN) 200 MG tablet Take 1 tablet (200 mg total) by mouth daily for 2 doses. 2 tablet Margarette Canada,  NP   phenazopyridine (PYRIDIUM) 200 MG tablet Take 1 tablet (200 mg total) by mouth 3 (three) times daily. 6 tablet Margarette Canada, NP      PDMP not reviewed this encounter.   Margarette Canada, NP 12/27/21 [phone]

## 2021-12-27 NOTE — Discharge Instructions (Addendum)
Take the Flagyl (metronidazole) 500 mg twice daily for treatment of your bacterial vaginosis.  Avoid alcohol while on the metronidazole as taken together will cause of vomiting.  Bacterial vaginosis is often caused by a imbalance of bacteria in your vaginal vault.  This is sometimes a result of using tampons or hormonal fluctuations during her menstrual cycle.  You if your symptoms are recurrent you can try using a boric acid suppository twice weekly to help maintain the acid-base balance in your vagina vault which could prevent further infection.  You can also try vaginal probiotics to help return normal bacterial balance.   Take the Diflucan now and repeat in 1 week if you have continued symptoms for treatment of your yeast infection.  Take the Pyridium every 8 hours as needed for urinary discomfort.  It was in your urine a bright red-orange.

## 2021-12-29 LAB — URINE CULTURE

## 2021-12-30 ENCOUNTER — Other Ambulatory Visit: Payer: Self-pay

## 2021-12-30 ENCOUNTER — Ambulatory Visit (INDEPENDENT_AMBULATORY_CARE_PROVIDER_SITE_OTHER): Payer: BC Managed Care – PPO | Admitting: Obstetrics and Gynecology

## 2021-12-30 VITALS — Temp 98.8°F

## 2021-12-30 DIAGNOSIS — R300 Dysuria: Secondary | ICD-10-CM | POA: Diagnosis not present

## 2021-12-30 LAB — POCT URINALYSIS DIPSTICK
Bilirubin, UA: NEGATIVE
Glucose, UA: NEGATIVE
Ketones, UA: NEGATIVE
Leukocytes, UA: NEGATIVE
Nitrite, UA: NEGATIVE
Protein, UA: NEGATIVE
Spec Grav, UA: 1.015 (ref 1.010–1.025)
Urobilinogen, UA: 0.2 E.U./dL
pH, UA: 6 (ref 5.0–8.0)

## 2021-12-30 NOTE — Progress Notes (Signed)
Patient came in for a urine drop off nurse visit today. She has been having some back pain and dysuria. She was evaluated at UC diagnosed with BV and treated with Flagyl and Diflucan. She was also treated with Pyridium, which caused her urine to change color and contamination. I recommended her to follow UTI protocol.   Increase water intake and add cranberry juice. Take extra strength Tylenol per label instructions or Ibuprofen 800 mg every 8 hours if you are not pregnant. Continue taking the Pyridium.

## 2022-01-01 ENCOUNTER — Encounter: Payer: Self-pay | Admitting: Obstetrics and Gynecology

## 2022-01-01 LAB — URINE CULTURE

## 2022-05-29 ENCOUNTER — Ambulatory Visit: Payer: BC Managed Care – PPO | Admitting: Nurse Practitioner

## 2022-06-03 ENCOUNTER — Ambulatory Visit: Payer: Self-pay | Admitting: Nurse Practitioner

## 2022-06-12 ENCOUNTER — Encounter: Payer: Self-pay | Admitting: Nurse Practitioner

## 2022-06-12 ENCOUNTER — Ambulatory Visit (INDEPENDENT_AMBULATORY_CARE_PROVIDER_SITE_OTHER): Payer: BC Managed Care – PPO | Admitting: Nurse Practitioner

## 2022-06-12 VITALS — BP 128/79 | HR 89 | Ht 63.0 in | Wt 242.2 lb

## 2022-06-12 DIAGNOSIS — Z6841 Body Mass Index (BMI) 40.0 and over, adult: Secondary | ICD-10-CM | POA: Insufficient documentation

## 2022-06-12 DIAGNOSIS — Z0000 Encounter for general adult medical examination without abnormal findings: Secondary | ICD-10-CM | POA: Insufficient documentation

## 2022-06-12 NOTE — Assessment & Plan Note (Signed)
Patient is working on weight loss by decreasing calories, exercising at least 3 times a week.  Patient is also on a ketogenic diet, increasing hydration. We will reassess weight in 6 to 12 weeks.

## 2022-06-12 NOTE — Patient Instructions (Signed)

## 2022-06-12 NOTE — Assessment & Plan Note (Signed)
Patient is new establishing care today.  Completed annual physical exam, head to toe assessment completed patient has no new concerns.  I provided education on health maintenance and preventative care.  Printed handouts given.  Patient is up-to-date with Pap.  Completed labs-CBC CMP lipid panel.

## 2022-06-12 NOTE — Progress Notes (Signed)
New Patient Note  RE: Anna Baldwin MRN: [id number] DOB: Apr 28, 1986 Date of Office Visit: 06/12/2022  Chief Complaint: Establish Care and Annual Exam  History of Present Illness: .   Encounter for general adult medical examination  Physical: Patient's last physical exam was 1 year ago .  Weight: is not appropriate for height (BMI less than 27%) ;  Blood Pressure: Normal (BP less than 120/80) ;  Medical History: Patient history reviewed ; Family history reviewed ;  Allergies Reviewed: No change in current allergies ;  Medications Reviewed: Medications reviewed - no changes ;  Lipids: Normal lipid levels ; labs completed results pending Smoking: Life-long non-smoker  Physical Activity: Exercises at least 3 times per week  Alcohol/Drug Use: Is a non-drinker ; No illicit drug use  Patient is not afflicted from Stress Incontinence and Urge Incontinence  Safety: reviewed ; Patient wears a seat belt, has smoke detectors, has carbon monoxide detectors, practices appropriate gun safety, and wears sunscreen with extended sun exposure. Dental Care: biannual cleanings, brushes and flosses daily. Ophthalmology/Optometry: Annual visit.  Hearing loss: none Vision impairments: none   Assessment and Plan: Anna Baldwin is a 36 y.o. female with: Annual physical exam Patient is new establishing care today.  Completed annual physical exam, head to toe assessment completed patient has no new concerns.  I provided education on health maintenance and preventative care.  Printed handouts given.  Patient is up-to-date with Pap.  Completed labs-CBC CMP lipid panel.  BMI 40.0-44.9, adult [facility]) Patient is working on weight loss by decreasing calories, exercising at least 3 times a week.  Patient is also on a ketogenic diet, increasing hydration. We will reassess weight in 6 to 12 weeks.  Return in about 1 year (around 06/13/2023) for annual physical.   Diagnostics:   Past Medical History: Patient  Active Problem List   Diagnosis Date Noted   Annual physical exam 06/12/2022   BMI 40.0-44.9, adult (HCC) 06/12/2022   Obesity in pregnancy, antepartum, third trimester 03/22/2020   Abdominal pain affecting pregnancy 03/02/2020   Abdominal pain in pregnancy, third trimester 03/02/2020   Pregnancy 03/01/2020   AFI (amniotic fluid index) increased 02/07/2020   Indication for care in labor or delivery 01/21/2020   Past Medical History:  Diagnosis Date   Family history of breast cancer    Family history of ovarian cancer    Medical history non-contributory    Past Surgical History: Past Surgical History:  Procedure Laterality Date   NO PAST SURGERIES     Medication List:  No current outpatient medications on file.   No current facility-administered medications for this visit.   Allergies: No Known Allergies Social History: Social History   Socioeconomic History   Marital status: Married    Spouse name: Not on file   Number of children: Not on file   Years of education: Not on file   Highest education level: Not on file  Occupational History   Not on file  Tobacco Use   Smoking status: Never   Smokeless tobacco: Never  Vaping Use   Vaping Use: Never used  Substance and Sexual Activity   Alcohol use: Not Currently   Drug use: Never   Sexual activity: Yes    Birth control/protection: None  Other Topics Concern   Not on file  Social History Narrative   Not on file   Social Determinants of Health   Financial Resource Strain: Not on file  Food Insecurity: Not on file  Transportation Needs:  Not on file  Physical Activity: Not on file  Stress: Not on file  Social Connections: Not on file       Family History: Family History  Problem Relation Age of Onset   Breast cancer Maternal Aunt 30   Ovarian cancer Maternal Grandmother 59   Breast cancer Maternal Aunt 30   Breast cancer Maternal Aunt 30   Hyperlipidemia Mother    Diabetes Father          Review  of Systems  Constitutional: Negative.   HENT: Negative.    Eyes: Negative.   Respiratory: Negative.    Cardiovascular: Negative.   Gastrointestinal: Negative.   Genitourinary: Negative.   Musculoskeletal: Negative.   Skin: Negative.  Negative for rash.  Neurological: Negative.   Psychiatric/Behavioral: Negative.    All other systems reviewed and are negative.  Objective: BP 128/79   Pulse 89   Ht 5\' 3"  (1.6 m)   Wt 242 lb 3.2 oz (109.9 kg)   LMP 05/20/2022 (Exact Date) Comment: start  SpO2 97%   BMI 42.90 kg/m  Body mass index is 42.9 kg/m. Physical Exam Vitals and nursing note reviewed.  Constitutional:      Appearance: Normal appearance. She is overweight.  HENT:     Head: Normocephalic.     Right Ear: External ear normal.     Left Ear: External ear normal.  Eyes:     Conjunctiva/sclera: Conjunctivae normal.  Cardiovascular:     Rate and Rhythm: Normal rate and regular rhythm.     Pulses: Normal pulses.     Heart sounds: Normal heart sounds.  Pulmonary:     Effort: Pulmonary effort is normal.     Breath sounds: Normal breath sounds.  Abdominal:     General: Bowel sounds are normal.  Skin:    General: Skin is warm.     Findings: No rash.  Neurological:     General: No focal deficit present.     Mental Status: She is alert and oriented to person, place, and time.  Psychiatric:        Mood and Affect: Mood normal.        Behavior: Behavior normal. Behavior is cooperative.    The plan was reviewed with the patient/family, and all questions/concerned were addressed.  It was my pleasure to see Anna Baldwin today and participate in her care. Please feel free to contact me with any questions or concerns.  Sincerely,  Leane Para NP [facility]

## 2022-06-13 LAB — CBC WITH DIFFERENTIAL/PLATELET
Basophils Absolute: 0.1 10*3/uL (ref 0.0–0.2)
Basos: 1 %
EOS (ABSOLUTE): 0.2 10*3/uL (ref 0.0–0.4)
Eos: 2 %
Hematocrit: 39.2 % (ref 34.0–46.6)
Hemoglobin: 12.5 g/dL (ref 11.1–15.9)
Immature Grans (Abs): 0 10*3/uL (ref 0.0–0.1)
Immature Granulocytes: 0 %
Lymphocytes Absolute: 4.1 10*3/uL — ABNORMAL HIGH (ref 0.7–3.1)
Lymphs: 37 %
MCH: 25.8 pg — ABNORMAL LOW (ref 26.6–33.0)
MCHC: 31.9 g/dL (ref 31.5–35.7)
MCV: 81 fL (ref 79–97)
Monocytes Absolute: 0.5 10*3/uL (ref 0.1–0.9)
Monocytes: 4 %
Neutrophils Absolute: 6.1 10*3/uL (ref 1.4–7.0)
Neutrophils: 56 %
Platelets: 330 10*3/uL (ref 150–450)
RBC: 4.84 x10E6/uL (ref 3.77–5.28)
RDW: 15.5 % — ABNORMAL HIGH (ref 11.7–15.4)
WBC: 10.9 10*3/uL — ABNORMAL HIGH (ref 3.4–10.8)

## 2022-06-13 LAB — COMPREHENSIVE METABOLIC PANEL
ALT: 16 IU/L (ref 0–32)
AST: 25 IU/L (ref 0–40)
Albumin/Globulin Ratio: 1.5 (ref 1.2–2.2)
Albumin: 4 g/dL (ref 3.9–4.9)
Alkaline Phosphatase: 72 IU/L (ref 44–121)
BUN/Creatinine Ratio: 13 (ref 9–23)
BUN: 12 mg/dL (ref 6–20)
Bilirubin Total: 0.4 mg/dL (ref 0.0–1.2)
CO2: 21 mmol/L (ref 20–29)
Calcium: 9.1 mg/dL (ref 8.7–10.2)
Chloride: 107 mmol/L — ABNORMAL HIGH (ref 96–106)
Creatinine, Ser: 0.92 mg/dL (ref 0.57–1.00)
Globulin, Total: 2.7 g/dL (ref 1.5–4.5)
Glucose: 91 mg/dL (ref 70–99)
Potassium: 4 mmol/L (ref 3.5–5.2)
Sodium: 139 mmol/L (ref 134–144)
Total Protein: 6.7 g/dL (ref 6.0–8.5)
eGFR: 84 mL/min/{1.73_m2} (ref >59)

## 2022-06-13 LAB — LIPID PANEL
Chol/HDL Ratio: 4.3 ratio (ref 0.0–4.4)
Cholesterol, Total: 175 mg/dL (ref 100–199)
HDL: 41 mg/dL (ref >39)
LDL Chol Calc (NIH): 115 mg/dL — ABNORMAL HIGH (ref 0–99)
Triglycerides: 104 mg/dL (ref 0–149)
VLDL Cholesterol Cal: 19 mg/dL (ref 5–40)

## 2022-06-15 ENCOUNTER — Other Ambulatory Visit: Payer: Self-pay | Admitting: Nurse Practitioner

## 2022-06-15 DIAGNOSIS — R739 Hyperglycemia, unspecified: Secondary | ICD-10-CM

## 2022-06-16 ENCOUNTER — Telehealth: Payer: Self-pay | Admitting: Obstetrics and Gynecology

## 2022-06-16 NOTE — Telephone Encounter (Signed)
[facility], left message to advise [facility] and  to return call if any questions.

## 2022-06-16 NOTE — Telephone Encounter (Signed)
Patient called and states she visited pharmacy to pick up her daughters([phone]) RX for [facility] and states that [facility] has been reaching out to facility for confirmation to be able to mail the prescription. Pt states there is a cost if she picks it up in pharmacy but through [facility] it is free. Mother would like to be notified once completed. Please advise.   The number to [facility]: [phone]

## 2022-07-20 ENCOUNTER — Ambulatory Visit (HOSPITAL_COMMUNITY)
Admission: EM | Admit: 2022-07-20 | Discharge: 2022-07-20 | Disposition: A | Discharge status: Home / Self Care | Payer: Worker's Compensation | Attending: Family Medicine | Admitting: Family Medicine

## 2022-07-20 ENCOUNTER — Emergency Department (HOSPITAL_BASED_OUTPATIENT_CLINIC_OR_DEPARTMENT_OTHER): Payer: No Typology Code available for payment source

## 2022-07-20 ENCOUNTER — Ambulatory Visit (HOSPITAL_COMMUNITY): Payer: BC Managed Care – PPO | Attending: Family Medicine

## 2022-07-20 ENCOUNTER — Encounter (HOSPITAL_COMMUNITY): Payer: Self-pay

## 2022-07-20 ENCOUNTER — Encounter (HOSPITAL_BASED_OUTPATIENT_CLINIC_OR_DEPARTMENT_OTHER): Payer: Self-pay

## 2022-07-20 DIAGNOSIS — M25561 Pain in right knee: Secondary | ICD-10-CM

## 2022-07-20 DIAGNOSIS — Y990 Civilian activity done for income or pay: Secondary | ICD-10-CM | POA: Diagnosis not present

## 2022-07-20 DIAGNOSIS — W19XXXA Unspecified fall, initial encounter: Secondary | ICD-10-CM

## 2022-07-20 DIAGNOSIS — S82111A Displaced fracture of right tibial spine, initial encounter for closed fracture: Secondary | ICD-10-CM | POA: Insufficient documentation

## 2022-07-20 DIAGNOSIS — S8991XA Unspecified injury of right lower leg, initial encounter: Secondary | ICD-10-CM | POA: Diagnosis present

## 2022-07-20 NOTE — ED Provider Notes (Addendum)
[facility]    CSN: [id number] Arrival date & time: 07/20/22  1753      History   Chief Complaint Chief Complaint  Patient presents with   Knee Injury    HPI Anna Baldwin is a 36 y.o. female.   HPI Here for right knee pain.  She slipped while walking and then her right leg went underneath her and she felt a pop and then pain in her right anterolateral knee.  The pain is starting to radiate down into her upper shin now.  Last menstrual cycle was August 13.  Last EGFR was normal Past Medical History:  Diagnosis Date   Family history of breast cancer    Family history of ovarian cancer    Medical history non-contributory     Patient Active Problem List   Diagnosis Date Noted   Annual physical exam 06/12/2022   BMI 40.0-44.9, adult ([place]) 06/12/2022   Obesity in pregnancy, antepartum, third trimester 03/22/2020   Abdominal pain affecting pregnancy 03/02/2020   Abdominal pain in pregnancy, third trimester 03/02/2020   Pregnancy 03/01/2020   AFI (amniotic fluid index) increased 02/07/2020   Indication for care in labor or delivery 01/21/2020    Past Surgical History:  Procedure Laterality Date   NO PAST SURGERIES      OB History     Gravida  4   Para  3   Term  3   Preterm      AB  1   Living  3      SAB  1   IAB      Ectopic      Multiple  0   Live Births  3            Home Medications    Prior to Admission medications   Not on File    Family History Family History  Problem Relation Age of Onset   Breast cancer Maternal Aunt 64   Ovarian cancer Maternal Grandmother 62   Breast cancer Maternal Aunt 30   Breast cancer Maternal Aunt 30   Hyperlipidemia Mother    Diabetes Father     Social History Social History   Tobacco Use   Smoking status: Never   Smokeless tobacco: Never  Vaping Use   Vaping Use: Never used  Substance Use Topics   Alcohol use: Not Currently   Drug use: Never     Allergies    Patient has no known allergies.   Review of Systems Review of Systems   Physical Exam Triage Vital Signs ED Triage Vitals  Enc Vitals Group     BP 07/20/22 1919 108/76     Pulse Rate 07/20/22 1919 67     Resp 07/20/22 1919 16     Temp 07/20/22 1919 98.6 F (37 C)     Temp Source 07/20/22 1919 Oral     SpO2 07/20/22 1919 98 %     Weight 07/20/22 1921 247 lb (112 kg)     Height 07/20/22 1921 5' 3"  (1.6 m)     Head Circumference --      Peak Flow --      Pain Score 07/20/22 1921 10     Pain Loc --      Pain Edu? --      Excl. in [place]? --    No data found.  Updated Vital Signs BP 108/76 (BP Location: Left Arm)   Pulse 67   Temp 98.6 F (37 C) (  Oral)   Resp 16   Ht 5' 3"  (1.6 m)   Wt 112 kg   LMP 07/12/2022 (Exact Date)   SpO2 98%   Breastfeeding No   BMI 43.75 kg/m   Visual Acuity Right Eye Distance:   Left Eye Distance:   Bilateral Distance:    Right Eye Near:   Left Eye Near:    Bilateral Near:     Physical Exam Vitals reviewed.  Constitutional:      General: She is not in acute distress.    Appearance: She is not ill-appearing, toxic-appearing or diaphoretic.  Musculoskeletal:     Comments: There is tenderness and swelling over the right anterior knee.  Neurological:     Mental Status: She is alert and oriented to person, place, and time.  Psychiatric:        Behavior: Behavior normal.      UC Treatments / Results  Labs (all labs ordered are listed, but only abnormal results are displayed) Labs Reviewed - No data to display  EKG   Radiology DG Knee AP/LAT W/Sun[place] Right  Result Date: 07/20/2022 CLINICAL DATA:  Fall with knee pain EXAM: RIGHT KNEE 3 VIEWS COMPARISON:  None Available. FINDINGS: No dislocation. Possible intra-articular avulsion fracture at the tibial spines. Moderate knee effusion. IMPRESSION: Possible intra-articular avulsion type fracture at the tibial spine region. Suggest CT for further assessment. Moderate knee effusion  These results will be called to the ordering clinician or representative by the Radiologist Assistant, and communication documented in the PACS or [employer]. Electronically Signed   By: Donavan Foil M.D.   On: 07/20/2022 19:58    Procedures Procedures (including critical care time)  Medications Ordered in UC Medications - No data to display  Initial Impression / Assessment and Plan / UC Course  I have reviewed the triage vital signs and the nursing notes.  Pertinent labs & imaging results that were available during my care of the patient were reviewed by me and considered in my medical decision making (see chart for details).     X-ray shows possible avulsion fracture of the tibial spine.  CT recommended.  Discussed options with her, and she wishes to go and proceed to the emergency room for definitive evaluation and treatment. Final Clinical Impressions(s) / UC Diagnoses   Final diagnoses:  Acute pain of right knee     Discharge Instructions      He is proceed to the emergency room for higher level of care and evaluation     ED Prescriptions   None    PDMP not reviewed this encounter.   Barrett Henle, MD 07/20/22 2016    Barrett Henle, MD 07/20/22 2031

## 2022-07-20 NOTE — Discharge Instructions (Addendum)
He is proceed to the emergency room for higher level of care and evaluation

## 2022-07-20 NOTE — ED Triage Notes (Signed)
Pt presents to the ED after falling at work. States that her right leg went backwards and she felt a pop in right knee. Pt reports pain in right knee.   Was seen at [facility] where she had an xray that showed possible avulsion fx of the tibial spine and was sent here for CT scan.

## 2022-07-20 NOTE — ED Triage Notes (Signed)
Patient injured the right knee when walking, she slipped and feel and the right knee went backwards and popped.   Patient states it feels heavy and she can not move it anymore.

## 2022-07-21 ENCOUNTER — Emergency Department (HOSPITAL_BASED_OUTPATIENT_CLINIC_OR_DEPARTMENT_OTHER)
Admission: EM | Admit: 2022-07-21 | Discharge: 2022-07-21 | Disposition: A | Discharge status: Home / Self Care | Payer: No Typology Code available for payment source | Attending: Emergency Medicine | Admitting: Emergency Medicine

## 2022-07-21 DIAGNOSIS — S82111A Displaced fracture of right tibial spine, initial encounter for closed fracture: Secondary | ICD-10-CM

## 2022-07-21 MED ORDER — HYDROCODONE-ACETAMINOPHEN 5-325 MG PO TABS: 1.0000 | ORAL_TABLET | Freq: Four times a day (QID) | ORAL | 0 refills | Status: DC | PRN

## 2022-07-21 MED ORDER — HYDROCODONE-ACETAMINOPHEN 5-325 MG PO TABS
1.0000 | ORAL_TABLET | Freq: Once | ORAL | Status: AC
  Administered 2022-07-21: 1 via ORAL
  Filled 2022-07-21: qty 1

## 2022-07-21 NOTE — ED Provider Notes (Signed)
[facility] EMERGENCY DEPT  Provider Note  CSN: [id number] Arrival date & time: 07/20/22 2049  History Chief Complaint  Patient presents with   Fall   Knee Pain    Anna Baldwin is a 36 y.o. female fell at work earlier on day of arrival injuring her R knee. Heard a pop. Went to UC where xray was concerning for tibial spine avulsion and advised to come to the ED for CT. Pain is moderate, aching and worse with movement.    Home Medications Prior to Admission medications   Medication Sig Start Date End Date Taking? Authorizing Provider  HYDROcodone-acetaminophen (NORCO/VICODIN) 5-325 MG tablet Take 1 tablet by mouth every 6 (six) hours as needed for severe pain. 07/21/22  Yes Pollyann Savoy, MD     Allergies    Patient has no allergy information on record.   Review of Systems   Review of Systems Please see HPI for pertinent positives and negatives  Physical Exam BP 116/68 (BP Location: Right Arm)   Pulse 78   Temp 97.7 F (36.5 C)   Resp 18   Ht 5\' 3"  (1.6 m)   Wt 112 kg   LMP 07/12/2022 (Exact Date)   SpO2 99%   BMI 43.74 kg/m   Physical Exam Vitals and nursing note reviewed.  HENT:     Head: Normocephalic.     Nose: Nose normal.  Eyes:     Extraocular Movements: Extraocular movements intact.  Pulmonary:     Effort: Pulmonary effort is normal.  Musculoskeletal:        General: Swelling and tenderness present.     Cervical back: Neck supple.     Comments: R knee effusion, decreased ROM due to pain, tender posteriorly, distally NVI  Skin:    Findings: No rash (on exposed skin).  Neurological:     Mental Status: She is alert and oriented to person, place, and time.  Psychiatric:        Mood and Affect: Mood normal.     ED Results / Procedures / Treatments   EKG None  Procedures Procedures  Medications Ordered in the ED Medications  HYDROcodone-acetaminophen (NORCO/VICODIN) 5-325 MG per tablet 1 tablet (1 tablet Oral Given  07/21/22 0254)    Initial Impression and Plan  Patient here with knee injury, outpatient xray concerning for fracture. I personally viewed the images from radiology studies and agree with radiologist interpretation: CT is positive for tibial spine avulsion. Will discuss management with Ortho. Pain medication ordered for comfort.    ED Course   Clinical Course as of 07/21/22 0303  Tue Jul 21, 2022  0259 Spoke with Dr. Jul 23, 2022, Ortho, who recommends knee immobilizer, crutches for comfort but can be toe-touch if she tolerates. Outpatient follow up. Patient works in [place] but lives in [place] and typically goes to the [place] area for medical care. Given local Ortho follow up if she decides to come this way or she can call Ortho at [facility] for follow up. Rx for norco for pain.  [CS]    Clinical Course User Index [CS] [facility], MD     MDM Rules/Calculators/A&P Medical Decision Making Problems Addressed: Closed displaced fracture of spine of right tibia, initial encounter: acute illness or injury  Amount and/or Complexity of Data Reviewed Radiology: ordered and independent interpretation performed. Decision-making details documented in ED Course.  Risk Prescription drug management.    Final Clinical Impression(s) / ED Diagnoses Final diagnoses:  Closed displaced fracture  of spine of right tibia, initial encounter    Rx / DC Orders ED Discharge Orders          Ordered    HYDROcodone-acetaminophen (NORCO/VICODIN) 5-325 MG tablet  Every 6 hours PRN        07/21/22 0303             Pollyann Savoy, MD 07/21/22 [phone]

## 2022-07-21 NOTE — ED Notes (Signed)
Pt verbalizes understanding of discharge instructions. Opportunity for questioning and answers were provided. Pt discharged from ED to home with husband.    

## 2022-07-21 NOTE — ED Notes (Signed)
Pt fell at work today injuring rt. Knee.  +fx

## 2022-07-28 ENCOUNTER — Encounter: Payer: Self-pay | Admitting: Family Medicine

## 2022-07-28 ENCOUNTER — Ambulatory Visit: Payer: BC Managed Care – PPO | Admitting: Family Medicine

## 2022-07-28 VITALS — BP 103/73 | HR 75 | Temp 97.8°F

## 2022-07-28 DIAGNOSIS — R300 Dysuria: Secondary | ICD-10-CM | POA: Diagnosis not present

## 2022-07-28 DIAGNOSIS — N3000 Acute cystitis without hematuria: Secondary | ICD-10-CM

## 2022-07-28 LAB — MICROSCOPIC EXAMINATION: Renal Epithel, UA: NONE SEEN /hpf

## 2022-07-28 LAB — URINALYSIS, ROUTINE W REFLEX MICROSCOPIC
Bilirubin, UA: NEGATIVE
Leukocytes,UA: NEGATIVE
Nitrite, UA: POSITIVE — AB
RBC, UA: NEGATIVE
Specific Gravity, UA: 1.015 (ref 1.005–1.030)
Urobilinogen, Ur: 1 mg/dL (ref 0.2–1.0)
pH, UA: 7 (ref 5.0–7.5)

## 2022-07-28 MED ORDER — CEFDINIR 300 MG PO CAPS: 300.0000 mg | ORAL_CAPSULE | Freq: Two times a day (BID) | ORAL | 0 refills | Status: AC

## 2022-07-28 NOTE — Progress Notes (Signed)
   Assessment & Plan:  1. Acute cystitis without hematuria Education provided on UTIs. Encouraged adequate hydration.  - Urine Culture - cefdinir (OMNICEF) 300 MG capsule; Take 1 capsule (300 mg total) by mouth 2 (two) times daily for 7 days.  Dispense: 14 capsule; Refill: 0  2. Dysuria - Urinalysis, Routine w reflex microscopic - Urine dipstick shows positive for protein, positive for glucose, positive for nitrates, and positive for ketones.  Micro exam: 0-5 WBC's per HPF, 0-2 RBC's per HPF, and many bacteria.   Follow up plan: Return if symptoms worsen or fail to improve.  Deliah Boston, MSN, APRN, FNP-C [facility]  Subjective:   Patient ID: Arda Daggs, female    DOB: 03-04-1986, 36 y.o.   MRN: [id number]  HPI: Hannahgrace Lalli is a 36 y.o. female presenting on 07/28/2022 for Dysuria and Urinary Frequency (X 1 day. Used OTC AZO)  Patient complains of dysuria and frequency. She has had symptoms for 1 day. Patient also complains of fever last night. Patient denies stomach ache. Patient does have a history of recurrent UTI.  Patient does have a history of pyelonephritis.    ROS: Negative unless specifically indicated above in HPI.   Relevant past medical history reviewed and updated as indicated.   Allergies and medications reviewed and updated.   Current Outpatient Medications:    HYDROcodone-acetaminophen (NORCO/VICODIN) 5-325 MG tablet, Take 1 tablet by mouth every 6 (six) hours as needed for severe pain., Disp: 12 tablet, Rfl: 0  Not on File  Objective:   BP 103/73   Pulse 75   Temp 97.8 F (36.6 C) (Temporal)   LMP 07/12/2022 (Exact Date)   SpO2 98%    Physical Exam Vitals reviewed.  Constitutional:      General: She is not in acute distress.    Appearance: Normal appearance. She is not ill-appearing, toxic-appearing or diaphoretic.  HENT:     Head: Normocephalic and atraumatic.  Eyes:     General: No scleral icterus.        Right eye: No discharge.        Left eye: No discharge.     Conjunctiva/sclera: Conjunctivae normal.  Cardiovascular:     Rate and Rhythm: Normal rate.  Pulmonary:     Effort: Pulmonary effort is normal. No respiratory distress.  Musculoskeletal:        General: Normal range of motion.     Cervical back: Normal range of motion.  Skin:    General: Skin is warm and dry.     Capillary Refill: Capillary refill takes less than 2 seconds.  Neurological:     General: No focal deficit present.     Mental Status: She is alert and oriented to person, place, and time. Mental status is at baseline.  Psychiatric:        Mood and Affect: Mood normal.        Behavior: Behavior normal.        Thought Content: Thought content normal.        Judgment: Judgment normal.

## 2022-08-01 LAB — URINE CULTURE

## 2022-09-14 ENCOUNTER — Encounter: Payer: Self-pay | Admitting: Obstetrics and Gynecology

## 2022-10-17 ENCOUNTER — Telehealth: Payer: BC Managed Care – PPO | Admitting: Nurse Practitioner

## 2022-10-17 DIAGNOSIS — T3695XA Adverse effect of unspecified systemic antibiotic, initial encounter: Secondary | ICD-10-CM

## 2022-10-17 DIAGNOSIS — B379 Candidiasis, unspecified: Secondary | ICD-10-CM

## 2022-10-17 DIAGNOSIS — R399 Unspecified symptoms and signs involving the genitourinary system: Secondary | ICD-10-CM | POA: Diagnosis not present

## 2022-10-17 MED ORDER — FLUCONAZOLE 150 MG PO TABS: 150.0000 mg | ORAL_TABLET | Freq: Once | ORAL | 0 refills | Status: AC

## 2022-10-17 MED ORDER — NITROFURANTOIN MONOHYD MACRO 100 MG PO CAPS: 100.0000 mg | ORAL_CAPSULE | Freq: Two times a day (BID) | ORAL | 0 refills | Status: AC

## 2022-10-17 NOTE — Progress Notes (Signed)
Virtual Visit Consent   Anna Baldwin, you are scheduled for a virtual visit with a [place] provider today. Just as with appointments in the office, your consent must be obtained to participate. Your consent will be active for this visit and any virtual visit you may have with one of our providers in the next 365 days. If you have a MyChart account, a copy of this consent can be sent to you electronically.  As this is a virtual visit, video technology does not allow for your provider to perform a traditional examination. This may limit your provider's ability to fully assess your condition. If your provider identifies any concerns that need to be evaluated in person or the need to arrange testing (such as labs, EKG, etc.), we will make arrangements to do so. Although advances in technology are sophisticated, we cannot ensure that it will always work on either your end or our end. If the connection with a video visit is poor, the visit may have to be switched to a telephone visit. With either a video or telephone visit, we are not always able to ensure that we have a secure connection.  By engaging in this virtual visit, you consent to the provision of healthcare and authorize for your insurance to be billed (if applicable) for the services provided during this visit. Depending on your insurance coverage, you may receive a charge related to this service.  I need to obtain your verbal consent now. Are you willing to proceed with your visit today? Anna Baldwin has provided verbal consent on 10/17/2022 for a virtual visit (video or telephone). Gildardo Pounds, NP  Date: 10/17/2022 12:34 PM  Virtual Visit via Video Note   I, Gildardo Pounds, connected with  Anna Baldwin  ([id number], 07-24-1986) on 10/17/22 at 12:15 PM EST by a video-enabled telemedicine application and verified that I am speaking with the correct person using two identifiers.  Location: Patient: Virtual Visit  Location Patient: Home Provider: Virtual Visit Location Provider: Home Office   I discussed the limitations of evaluation and management by telemedicine and the availability of in person appointments. The patient expressed understanding and agreed to proceed.    History of Present Illness: Anna Baldwin is a 35 y.o. who identifies as a female who was assigned female at birth, and is being seen today for UTI SYMPTOMS.  Urinary Tract Infection: Patient complains of burning with urination, dysuria, hematuria, and urgency She has had symptoms for a few days. =Patient denies back pain, fever, and vaginal discharge. Patient does have a history of recurrent UTI.  Patient does not have a history of pyelonephritis.     Problems:  Patient Active Problem List   Diagnosis Date Noted   Annual physical exam 06/12/2022   BMI 40.0-44.9, adult ([place]) 06/12/2022   Obesity in pregnancy, antepartum, third trimester 03/22/2020   Abdominal pain affecting pregnancy 03/02/2020   Abdominal pain in pregnancy, third trimester 03/02/2020   Pregnancy 03/01/2020   AFI (amniotic fluid index) increased 02/07/2020   Indication for care in labor or delivery 01/21/2020    Allergies: No Known Allergies Medications:  Current Outpatient Medications:    fluconazole (DIFLUCAN) 150 MG tablet, Take 1 tablet (150 mg total) by mouth once for 1 dose., Disp: 1 tablet, Rfl: 0   nitrofurantoin, macrocrystal-monohydrate, (MACROBID) 100 MG capsule, Take 1 capsule (100 mg total) by mouth 2 (two) times daily for 5 days., Disp: 10 capsule, Rfl: 0   HYDROcodone-acetaminophen (NORCO/VICODIN)  5-325 MG tablet, Take 1 tablet by mouth every 6 (six) hours as needed for severe pain., Disp: 12 tablet, Rfl: 0  Observations/Objective: Patient is well-developed, well-nourished in no acute distress.  Resting comfortably  at home.  Head is normocephalic, atraumatic.  No labored breathing.  Speech is clear and coherent with logical content.   Patient is alert and oriented at baseline.    Assessment and Plan: 1. UTI symptoms - nitrofurantoin, macrocrystal-monohydrate, (MACROBID) 100 MG capsule; Take 1 capsule (100 mg total) by mouth 2 (two) times daily for 5 days.  Dispense: 10 capsule; Refill: 0  2. Antibiotic-induced yeast infection - fluconazole (DIFLUCAN) 150 MG tablet; Take 1 tablet (150 mg total) by mouth once for 1 dose.  Dispense: 1 tablet; Refill: 0    Follow Up Instructions: I discussed the assessment and treatment plan with the patient. The patient was provided an opportunity to ask questions and all were answered. The patient agreed with the plan and demonstrated an understanding of the instructions.  A copy of instructions were sent to the patient via MyChart unless otherwise noted below.    The patient was advised to call back or seek an in-person evaluation if the symptoms worsen or if the condition fails to improve as anticipated.  Time:  I spent 11 minutes with the patient via telehealth technology discussing the above problems/concerns.    Claiborne Rigg, NP

## 2022-11-27 IMAGING — CR DG FOOT COMPLETE 3+V*L*
3 series · 3 of 3 positions shown · non-contrast
Comparison: None.

CLINICAL DATA: Left foot pain for years. More pain over the last 3
days.

EXAM:
LEFT FOOT - COMPLETE 3+ VIEW

[foot obl]
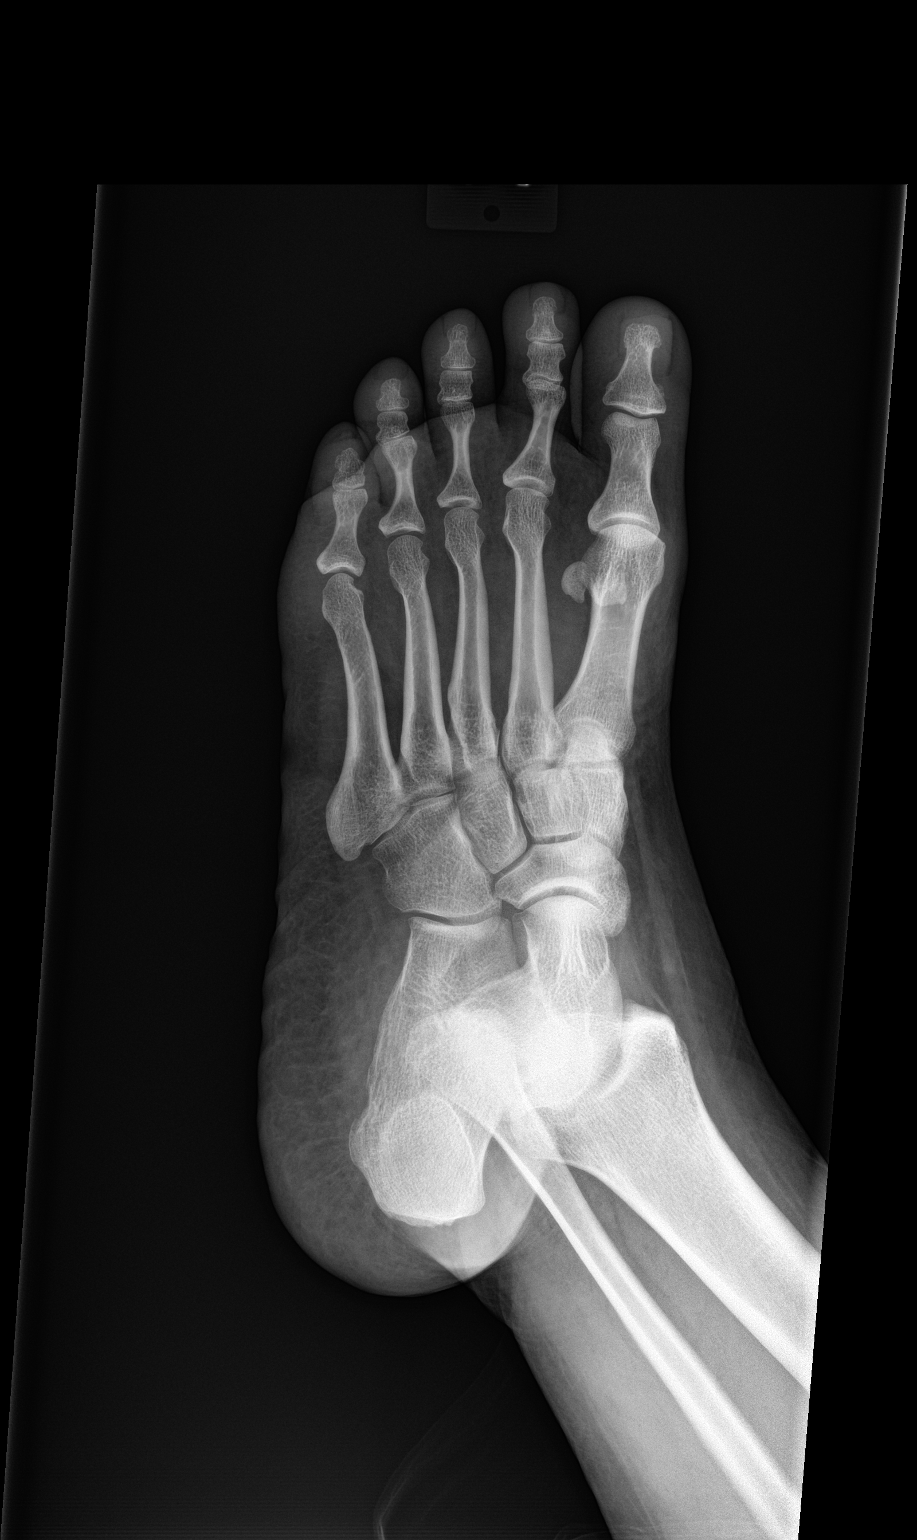

[foot lat]
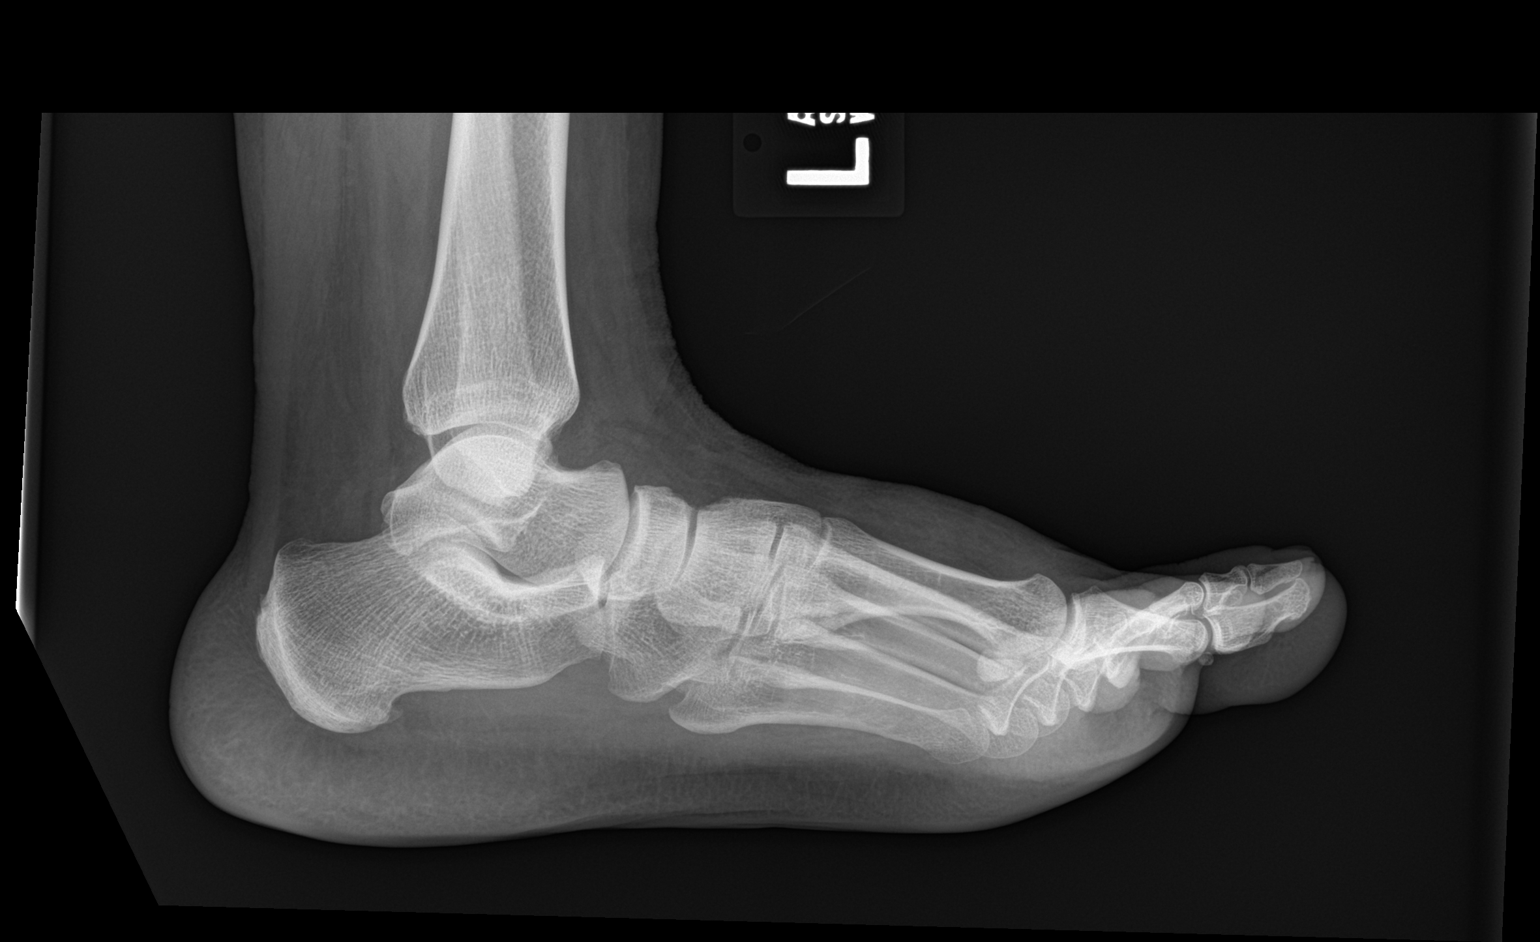

[foot ap]
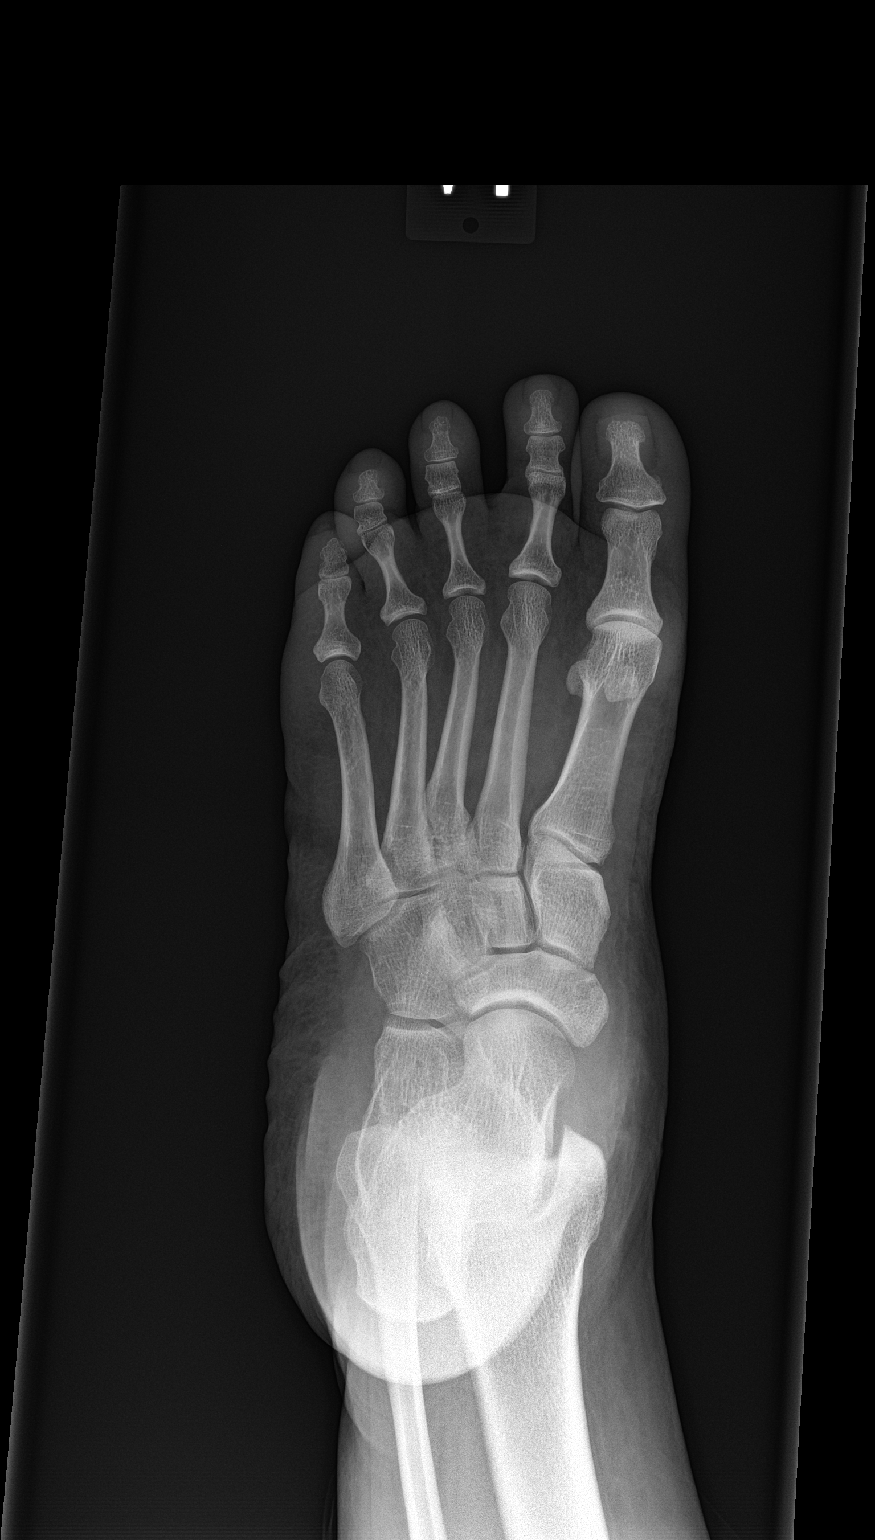

[3 of 3 positions shown; findings below may reference images not displayed]

FINDINGS: No fracture or bone lesion.

Joints are normally spaced and aligned.  No arthropathic changes.

Small plantar calcaneal spur.

Mild forefoot soft tissue swelling.
IMPRESSION: 1. No fracture or joint abnormality.
2. Small plantar calcaneal spur.

## 2022-12-15 ENCOUNTER — Encounter: Payer: Self-pay | Admitting: *Deleted

## 2023-04-09 ENCOUNTER — Telehealth: Payer: BC Managed Care – PPO | Admitting: Family Medicine

## 2023-04-09 DIAGNOSIS — R399 Unspecified symptoms and signs involving the genitourinary system: Secondary | ICD-10-CM | POA: Diagnosis not present

## 2023-04-09 MED ORDER — FLUCONAZOLE 150 MG PO TABS: 150.0000 mg | ORAL_TABLET | Freq: Once | ORAL | 0 refills | Status: AC

## 2023-04-09 MED ORDER — SULFAMETHOXAZOLE-TRIMETHOPRIM 800-160 MG PO TABS: 1.0000 | ORAL_TABLET | Freq: Two times a day (BID) | ORAL | 0 refills | Status: AC

## 2023-04-09 NOTE — Patient Instructions (Signed)
Urinary Tract Infection, Adult  A urinary tract infection (UTI) is an infection of any part of the urinary tract. The urinary tract includes the kidneys, ureters, bladder, and urethra. These organs make, store, and get rid of urine in the body. An upper UTI affects the ureters and kidneys. A lower UTI affects the bladder and urethra. What are the causes? Most urinary tract infections are caused by bacteria in your genital area around your urethra, where urine leaves your body. These bacteria grow and cause inflammation of your urinary tract. What increases the risk? You are more likely to develop this condition if: You have a urinary catheter that stays in place. You are not able to control when you urinate or have a bowel movement (incontinence). You are female and you: Use a spermicide or diaphragm for birth control. Have low estrogen levels. Are pregnant. You have certain genes that increase your risk. You are sexually active. You take antibiotic medicines. You have a condition that causes your flow of urine to slow down, such as: An enlarged prostate, if you are female. Blockage in your urethra. A kidney stone. A nerve condition that affects your bladder control (neurogenic bladder). Not getting enough to drink, or not urinating often. You have certain medical conditions, such as: Diabetes. A weak disease-fighting system (immunesystem). Sickle cell disease. Gout. Spinal cord injury. What are the signs or symptoms? Symptoms of this condition include: Needing to urinate right away (urgency). Frequent urination. This may include small amounts of urine each time you urinate. Pain or burning with urination. Blood in the urine. Urine that smells bad or unusual. Trouble urinating. Cloudy urine. Vaginal discharge, if you are female. Pain in the abdomen or the lower back. You may also have: Vomiting or a decreased appetite. Confusion. Irritability or tiredness. A fever or  chills. Diarrhea. The first symptom in older adults may be confusion. In some cases, they may not have any symptoms until the infection has worsened. How is this diagnosed? This condition is diagnosed based on your medical history and a physical exam. You may also have other tests, including: Urine tests. Blood tests. Tests for STIs (sexually transmitted infections). If you have had more than one UTI, a cystoscopy or imaging studies may be done to determine the cause of the infections. How is this treated? Treatment for this condition includes: Antibiotic medicine. Over-the-counter medicines to treat discomfort. Drinking enough water to stay hydrated. If you have frequent infections or have other conditions such as a kidney stone, you may need to see a health care provider who specializes in the urinary tract (urologist). In rare cases, urinary tract infections can cause sepsis. Sepsis is a life-threatening condition that occurs when the body responds to an infection. Sepsis is treated in the hospital with IV antibiotics, fluids, and other medicines. Follow these instructions at home:  Medicines Take over-the-counter and prescription medicines only as told by your health care provider. If you were prescribed an antibiotic medicine, take it as told by your health care provider. Do not stop using the antibiotic even if you start to feel better. General instructions Make sure you: Empty your bladder often and completely. Do not hold urine for long periods of time. Empty your bladder after sex. Wipe from front to back after urinating or having a bowel movement if you are female. Use each tissue only one time when you wipe. Drink enough fluid to keep your urine pale yellow. Keep all follow-up visits. This is important. Contact a health   care provider if: Your symptoms do not get better after 1-2 days. Your symptoms go away and then return. Get help right away if: You have severe pain in  your back or your lower abdomen. You have a fever or chills. You have nausea or vomiting. Summary A urinary tract infection (UTI) is an infection of any part of the urinary tract, which includes the kidneys, ureters, bladder, and urethra. Most urinary tract infections are caused by bacteria in your genital area. Treatment for this condition often includes antibiotic medicines. If you were prescribed an antibiotic medicine, take it as told by your health care provider. Do not stop using the antibiotic even if you start to feel better. Keep all follow-up visits. This is important. This information is not intended to replace advice given to you by your health care provider. Make sure you discuss any questions you have with your health care provider. Document Revised: 06/28/2020 Document Reviewed: 06/28/2020 Elsevier Patient Education  2023 Elsevier Inc.  

## 2023-04-09 NOTE — Progress Notes (Signed)
Virtual Visit Consent   Anna Baldwin, you are scheduled for a virtual visit with a [facility] provider today. Just as with appointments in the office, your consent must be obtained to participate. Your consent will be active for this visit and any virtual visit you may have with one of our providers in the next 365 days. If you have a MyChart account, a copy of this consent can be sent to you electronically.  As this is a virtual visit, video technology does not allow for your provider to perform a traditional examination. This may limit your provider's ability to fully assess your condition. If your provider identifies any concerns that need to be evaluated in person or the need to arrange testing (such as labs, EKG, etc.), we will make arrangements to do so. Although advances in technology are sophisticated, we cannot ensure that it will always work on either your end or our end. If the connection with a video visit is poor, the visit may have to be switched to a telephone visit. With either a video or telephone visit, we are not always able to ensure that we have a secure connection.  By engaging in this virtual visit, you consent to the provision of healthcare and authorize for your insurance to be billed (if applicable) for the services provided during this visit. Depending on your insurance coverage, you may receive a charge related to this service.  I need to obtain your verbal consent now. Are you willing to proceed with your visit today? Anna Baldwin has provided verbal consent on 04/09/2023 for a virtual visit (video or telephone). Georgana Curio, FNP  Date: 04/09/2023 3:35 PM  Virtual Visit via Video Note   I, Georgana Curio, connected with  Anna Baldwin  ([id number], 31-Oct-1986) on 04/09/23 at  4:45 PM EDT by a video-enabled telemedicine application and verified that I am speaking with the correct person using two identifiers.  Location: Patient: Virtual Visit Location  Patient: Home Provider: Virtual Visit Location Provider: Home Office   I discussed the limitations of evaluation and management by telemedicine and the availability of in person appointments. The patient expressed understanding and agreed to proceed.    History of Present Illness: Anna Baldwin is a 37 y.o. who identifies as a female who was assigned female at birth, and is being seen today for burning and frequency with urination. No fever.Marland Kitchen  HPI: HPI  Problems:  Patient Active Problem List   Diagnosis Date Noted   Annual physical exam 06/12/2022   BMI 40.0-44.9, adult (HCC) 06/12/2022   Obesity in pregnancy, antepartum, third trimester 03/22/2020   Abdominal pain affecting pregnancy 03/02/2020   Abdominal pain in pregnancy, third trimester 03/02/2020   Pregnancy 03/01/2020   AFI (amniotic fluid index) increased 02/07/2020   Indication for care in labor or delivery 01/21/2020    Allergies: No Known Allergies Medications:  Current Outpatient Medications:    fluconazole (DIFLUCAN) 150 MG tablet, Take 1 tablet (150 mg total) by mouth once for 1 dose., Disp: 1 tablet, Rfl: 0   sulfamethoxazole-trimethoprim (BACTRIM DS) 800-160 MG tablet, Take 1 tablet by mouth 2 (two) times daily for 7 days., Disp: 14 tablet, Rfl: 0   HYDROcodone-acetaminophen (NORCO/VICODIN) 5-325 MG tablet, Take 1 tablet by mouth every 6 (six) hours as needed for severe pain., Disp: 12 tablet, Rfl: 0  Observations/Objective: Patient is well-developed, well-nourished in no acute distress.  Resting comfortably in car.   Head is normocephalic, atraumatic.  No labored  breathing.  Speech is clear and coherent with logical content.  Patient is alert and oriented at baseline.    Assessment and Plan: 1. UTI symptoms  Increase fluids, preventative measures, UC if sx worsen.   Follow Up Instructions: I discussed the assessment and treatment plan with the patient. The patient was provided an opportunity to ask  questions and all were answered. The patient agreed with the plan and demonstrated an understanding of the instructions.  A copy of instructions were sent to the patient via MyChart unless otherwise noted below.     The patient was advised to call back or seek an in-person evaluation if the symptoms worsen or if the condition fails to improve as anticipated.  Time:  I spent 8 minutes with the patient via telehealth technology discussing the above problems/concerns.    Georgana Curio, FNP

## 2023-04-13 ENCOUNTER — Telehealth: Payer: BC Managed Care – PPO | Admitting: Nurse Practitioner

## 2023-04-13 DIAGNOSIS — R300 Dysuria: Secondary | ICD-10-CM

## 2023-04-13 MED ORDER — NITROFURANTOIN MONOHYD MACRO 100 MG PO CAPS: 100.0000 mg | ORAL_CAPSULE | Freq: Two times a day (BID) | ORAL | 0 refills | Status: AC

## 2023-04-13 NOTE — Progress Notes (Signed)
Virtual Visit Consent   Anna Baldwin, you are scheduled for a virtual visit with a [facility] provider today. Just as with appointments in the office, your consent must be obtained to participate. Your consent will be active for this visit and any virtual visit you may have with one of our providers in the next 365 days. If you have a MyChart account, a copy of this consent can be sent to you electronically.  As this is a virtual visit, video technology does not allow for your provider to perform a traditional examination. This may limit your provider's ability to fully assess your condition. If your provider identifies any concerns that need to be evaluated in person or the need to arrange testing (such as labs, EKG, etc.), we will make arrangements to do so. Although advances in technology are sophisticated, we cannot ensure that it will always work on either your end or our end. If the connection with a video visit is poor, the visit may have to be switched to a telephone visit. With either a video or telephone visit, we are not always able to ensure that we have a secure connection.  By engaging in this virtual visit, you consent to the provision of healthcare and authorize for your insurance to be billed (if applicable) for the services provided during this visit. Depending on your insurance coverage, you may receive a charge related to this service.  I need to obtain your verbal consent now. Are you willing to proceed with your visit today? [name] has provided verbal consent on 04/13/2023 for a virtual visit (video or telephone). Viviano Simas, FNP  Date: 04/13/2023 4:05 PM  Virtual Visit via Video Note   I, Viviano Simas, connected with  Anna Baldwin  ([id number], 1985-12-28) on 04/13/23 at  4:30 PM EDT by a video-enabled telemedicine application and verified that I am speaking with the correct person using two identifiers.  Location: Patient: Virtual Visit Location  Patient: Home Provider: Virtual Visit Location Provider: Home Office   I discussed the limitations of evaluation and management by telemedicine and the availability of in person appointments. The patient expressed understanding and agreed to proceed.    History of Present Illness: Anna Baldwin is a 37 y.o. who identifies as a female who was assigned female at birth, and is being seen today for follow up on a UTI.  She was seen on 04/09/23 and started on Bactrim   Her most recent culture shows susceptibility to Bactrim from August   Her symptoms have improved but not resolved   Continue to have cloudy urine with an odor  Discomfort with urination   She was treated with Macrobid in November and had much quicker relief   NO fever no N/V   Problems:  Patient Active Problem List   Diagnosis Date Noted   Annual physical exam 06/12/2022   BMI 40.0-44.9, adult (HCC) 06/12/2022   Obesity in pregnancy, antepartum, third trimester 03/22/2020   Abdominal pain affecting pregnancy 03/02/2020   Abdominal pain in pregnancy, third trimester 03/02/2020   Pregnancy 03/01/2020   AFI (amniotic fluid index) increased 02/07/2020   Indication for care in labor or delivery 01/21/2020    Allergies: No Known Allergies Medications:  Current Outpatient Medications:    HYDROcodone-acetaminophen (NORCO/VICODIN) 5-325 MG tablet, Take 1 tablet by mouth every 6 (six) hours as needed for severe pain., Disp: 12 tablet, Rfl: 0   sulfamethoxazole-trimethoprim (BACTRIM DS) 800-160 MG tablet, Take 1 tablet by  mouth 2 (two) times daily for 7 days., Disp: 14 tablet, Rfl: 0  Observations/Objective: Patient is well-developed, well-nourished in no acute distress.  Resting comfortably  at home.  Head is normocephalic, atraumatic.  No labored breathing.  Speech is clear and coherent with logical content.  Patient is alert and oriented at baseline.    Assessment and Plan: 1. Dysuria Push fluids  Advised  Urine testing with next episode to assess for antibiotic resistance  Strict follow up precautions if no improvement in the next 24 hours With onset of fever N/V seek in person evaluation  Stop Bactrim and start:  - nitrofurantoin, macrocrystal-monohydrate, (MACROBID) 100 MG capsule; Take 1 capsule (100 mg total) by mouth 2 (two) times daily for 5 days.  Dispense: 10 capsule; Refill: 0     Follow Up Instructions: I discussed the assessment and treatment plan with the patient. The patient was provided an opportunity to ask questions and all were answered. The patient agreed with the plan and demonstrated an understanding of the instructions.  A copy of instructions were sent to the patient via MyChart unless otherwise noted below.    The patient was advised to call back or seek an in-person evaluation if the symptoms worsen or if the condition fails to improve as anticipated.  Time:  I spent 15 minutes with the patient via telehealth technology discussing the above problems/concerns.    Viviano Simas, FNP

## 2023-05-12 NOTE — Patient Instructions (Signed)
Preventive Care 21-36 Years Old, Female Preventive care refers to lifestyle choices and visits with your health care provider that can promote health and wellness. Preventive care visits are also called wellness exams. What can I expect for my preventive care visit? Counseling During your preventive care visit, your health care provider may ask about your: Medical history, including: Past medical problems. Family medical history. Pregnancy history. Current health, including: Menstrual cycle. Method of birth control. Emotional well-being. Home life and relationship well-being. Sexual activity and sexual health. Lifestyle, including: Alcohol, nicotine or tobacco, and drug use. Access to firearms. Diet, exercise, and sleep habits. Work and work environment. Sunscreen use. Safety issues such as seatbelt and bike helmet use. Physical exam Your health care provider may check your: Height and weight. These may be used to calculate your BMI (body mass index). BMI is a measurement that tells if you are at a healthy weight. Waist circumference. This measures the distance around your waistline. This measurement also tells if you are at a healthy weight and may help predict your risk of certain diseases, such as type 2 diabetes and high blood pressure. Heart rate and blood pressure. Body temperature. Skin for abnormal spots. What immunizations do I need?  Vaccines are usually given at various ages, according to a schedule. Your health care provider will recommend vaccines for you based on your age, medical history, and lifestyle or other factors, such as travel or where you work. What tests do I need? Screening Your health care provider may recommend screening tests for certain conditions. This may include: Pelvic exam and Pap test. Lipid and cholesterol levels. Diabetes screening. This is done by checking your blood sugar (glucose) after you have not eaten for a while (fasting). Hepatitis  B test. Hepatitis C test. HIV (human immunodeficiency virus) test. STI (sexually transmitted infection) testing, if you are at risk. BRCA-related cancer screening. This may be done if you have a family history of breast, ovarian, tubal, or peritoneal cancers. Talk with your health care provider about your test results, treatment options, and if necessary, the need for more tests. Follow these instructions at home: Eating and drinking  Eat a healthy diet that includes fresh fruits and vegetables, whole grains, lean protein, and low-fat dairy products. Take vitamin and mineral supplements as recommended by your health care provider. Do not drink alcohol if: Your health care provider tells you not to drink. You are pregnant, may be pregnant, or are planning to become pregnant. If you drink alcohol: Limit how much you have to 0-1 drink a day. Know how much alcohol is in your drink. In the [place] one drink equals one 12 oz bottle of beer (355 mL), one 5 oz glass of wine (148 mL), or one 1 oz glass of hard liquor (44 mL). Lifestyle Brush your teeth every morning and night with fluoride toothpaste. Floss one time each day. Exercise for at least 30 minutes 5 or more days each week. Do not use any products that contain nicotine or tobacco. These products include cigarettes, chewing tobacco, and vaping devices, such as e-cigarettes. If you need help quitting, ask your health care provider. Do not use drugs. If you are sexually active, practice safe sex. Use a condom or other form of protection to prevent STIs. If you do not wish to become pregnant, use a form of birth control. If you plan to become pregnant, see your health care provider for a prepregnancy visit. Find healthy ways to manage stress, such as: Meditation,   yoga, or listening to music. Journaling. Talking to a trusted person. Spending time with friends and family. Minimize exposure to UV radiation to reduce your risk of skin  cancer. Safety Always wear your seat belt while driving or riding in a vehicle. Do not drive: If you have been drinking alcohol. Do not ride with someone who has been drinking. If you have been using any mind-altering substances or drugs. While texting. When you are tired or distracted. Wear a helmet and other protective equipment during sports activities. If you have firearms in your house, make sure you follow all gun safety procedures. Seek help if you have been physically or sexually abused. What's next? Go to your health care provider once a year for an annual wellness visit. Ask your health care provider how often you should have your eyes and teeth checked. Stay up to date on all vaccines. This information is not intended to replace advice given to you by your health care provider. Make sure you discuss any questions you have with your health care provider. Document Revised: 05/14/2021 Document Reviewed: 05/14/2021 Elsevier Patient Education  2024 Else[employer] Inc. Breast Self-Awareness Breast self-awareness is knowing how your breasts look and feel. You need to: Check your breasts on a regular basis. Tell your doctor about any changes. Become familiar with the look and feel of your breasts. This can help you catch a breast problem while it is still small and can be treated. You should do breast self-exams even if you have breast implants. What you need: A mirror. A well-lit room. A pillow or other soft object. How to do a breast self-exam Follow these steps to do a breast self-exam: Look for changes  Take off all the clothes above your waist. Stand in front of a mirror in a room with good lighting. Put your hands down at your sides. Compare your breasts in the mirror. Look for any difference between them, such as: A difference in shape. A difference in size. Wrinkles, dips, and bumps in one breast and not the other. Look at each breast for changes in the skin, such  as: Redness. Scaly areas. Skin that has gotten thicker. Dimpling. Open sores (ulcers). Look for changes in your nipples, such as: Fluid coming out of a nipple. Fluid around a nipple. Bleeding. Dimpling. Redness. A nipple that looks pushed in (retracted), or that has changed position. Feel for changes Lie on your back. Feel each breast. To do this: Pick a breast to feel. Place a pillow under the shoulder closest to that breast. Put the arm closest to that breast behind your head. Feel the nipple area of that breast using the hand of your other arm. Feel the area with the pads of your three middle fingers by making small circles with your fingers. Use light, medium, and firm pressure. Continue the overlapping circles, moving downward over the breast. Keep making circles with your fingers. Stop when you feel your ribs. Start making circles with your fingers again, this time going upward until you reach your collarbone. Then, make circles outward across your breast and into your armpit area. Squeeze your nipple. Check for discharge and lumps. Repeat these steps to check your other breast. Sit or stand in the tub or shower. With soapy water on your skin, feel each breast the same way you did when you were lying down. Write down what you find Writing down what you find can help you remember what to tell your doctor. Write down: What is   normal for each breast. Any changes you find in each breast. These include: The kind of changes you find. A tender or painful breast. Any lump you find. Write down its size and where it is. When you last had your monthly period (menstrual cycle). General tips If you are breastfeeding, the best time to check your breasts is after you feed your baby or after you use a breast pump. If you get monthly bleeding, the best time to check your breasts is 5-7 days after your monthly cycle ends. With time, you will become comfortable with the self-exam. You will  also start to know if there are changes in your breasts. Contact a doctor if: You see a change in the shape or size of your breasts or nipples. You see a change in the skin of your breast or nipples, such as red or scaly skin. You have fluid coming from your nipples that is not normal. You find a new lump or thick area. You have breast pain. You have any concerns about your breast health. Summary Breast self-awareness includes looking for changes in your breasts and feeling for changes within your breasts. You should do breast self-awareness in front of a mirror in a well-lit room. If you get monthly periods (menstrual cycles), the best time to check your breasts is 5-7 days after your period ends. Tell your doctor about any changes you see in your breasts. Changes include changes in size, changes on the skin, painful or tender breasts, or fluid from your nipples that is not normal. This information is not intended to replace advice given to you by your health care provider. Make sure you discuss any questions you have with your health care provider. Document Revised: 04/23/2022 Document Reviewed: 09/18/2021 Elsevier Patient Education  2024 [employer] Inc.  

## 2023-05-12 NOTE — Progress Notes (Signed)
GYNECOLOGY ANNUAL PHYSICAL EXAM PROGRESS NOTE  Subjective:    Anna Baldwin is a 37 y.o. [phone] female who presents for an annual exam.  The patient is sexually active. The patient participates in regular exercise: no. Has the patient ever been transfused or tattooed?: no. The patient reports that there is not domestic violence in her life.    The patient the following concerns/complaints today.:  Anna Baldwin expresses concern regarding need for an early mammogram. Notes several family members (aunts) with breast cancer, most diagnosed in late 14s or early 38s. Also reports h/o ovarian cancer in [facility].  Does also note some cancers on paternal side in great aunt with breast cancer and great uncle with colon cancer.  Reports that her mother had hereditary cancer screening which she thinks was negative, but would be interested in having her own screening performed due to family history on both sides of her family.    Does report some sharp breast pains that radiate bilaterally intermittently. Denies nipple pain, masses in the breast.    Menstrual History: Menarche age: 31 Patient's last menstrual period was 04/25/2023. Period Cycle (Days): 30 Period Duration (Days): 4-5 Period Pattern: Regular Menstrual Flow: Moderate, Heavy Menstrual Control: Maxi pad Menstrual Control Change Freq (Hours): 3 Dysmenorrhea: None     Gynecologic History:  Contraception: none History of STI's: Denies Last Pap: 06/10/2021. Results were: normal. Denies h/o abnormal pap smears.   OB History  Gravida Para Term Preterm AB Living  4 3 3  0 1 3  SAB IAB Ectopic Multiple Live Births  1 0 0 0 3    # Outcome Date GA Lbr Len/2nd Weight Sex Delivery Anes PTL Lv  4 Term 03/22/20 [redacted]w[redacted]d 01:48 7 lb 4.1 oz (3.29 kg) F Vag-Spont EPI  LIV     Name: Anna Baldwin     Apgar1: 6  Apgar5: 9  3 SAB 06/2018          2 Term 10/26/12 [redacted]w[redacted]d  6 lb 12 oz (3.062 kg) M Vag-Spont   LIV  1 Term 10/10/07 [redacted]w[redacted]d  7 lb 6 oz  (3.345 kg) F Vag-Spont   LIV    Past Medical History:  Diagnosis Date   Family history of breast cancer    Family history of ovarian cancer    Medical history non-contributory     Past Surgical History:  Procedure Laterality Date   NO PAST SURGERIES      Family History  Problem Relation Age of Onset   Breast cancer Maternal Aunt 30   Ovarian cancer Maternal Grandmother 25   Breast cancer Maternal Aunt 30   Breast cancer Maternal Aunt 30   Hyperlipidemia Mother    Diabetes Father     Social History   Socioeconomic History   Marital status: Married    Spouse name: Not on file   Number of children: Not on file   Years of education: Not on file   Highest education level: Not on file  Occupational History   Not on file  Tobacco Use   Smoking status: Never   Smokeless tobacco: Never  Vaping Use   Vaping Use: Never used  Substance and Sexual Activity   Alcohol use: Not Currently   Drug use: Never   Sexual activity: Yes    Birth control/protection: None  Other Topics Concern   Not on file  Social History Narrative   Not on file   Social Determinants of Health   Financial Resource Strain: Not  on file  Food Insecurity: Not on file  Transportation Needs: Not on file  Physical Activity: Not on file  Stress: Not on file  Social Connections: Not on file  Intimate Partner Violence: Not on file    Current Outpatient Medications on File Prior to Visit  Medication Sig Dispense Refill   HYDROcodone-acetaminophen (NORCO/VICODIN) 5-325 MG tablet Take 1 tablet by mouth every 6 (six) hours as needed for severe pain. 12 tablet 0   No current facility-administered medications on file prior to visit.    No Known Allergies   Review of Systems Constitutional: negative for chills, fatigue, fevers and sweats Eyes: negative for irritation, redness and visual disturbance Ears, nose, mouth, throat, and face: negative for hearing loss, nasal congestion, snoring and  tinnitus Respiratory: negative for asthma, cough, sputum Cardiovascular: negative for chest pain, dyspnea, exertional chest pressure/discomfort, irregular heart beat, palpitations and syncope Gastrointestinal: negative for abdominal pain, change in bowel habits, nausea and vomiting Genitourinary: negative for abnormal menstrual periods, genital lesions, sexual problems and vaginal discharge, dysuria and urinary incontinence Integument/breast: negative for breast lump, breast tenderness and nipple discharge Hematologic/lymphatic: negative for bleeding and easy bruising Musculoskeletal:negative for back pain and muscle weakness Neurological: negative for dizziness, headaches, vertigo and weakness Endocrine: negative for diabetic symptoms including polydipsia, polyuria and skin dryness Allergic/Immunologic: negative for hay fever and urticaria      Objective:  Blood pressure 101/65, pulse 75, resp. rate 16, height 5\' 3"  (1.6 m), weight 276 lb 11.2 oz (125.5 kg), last menstrual period 04/25/2023. Body mass index is 49.02 kg/m.  General Appearance:    Alert, cooperative, no distress, appears stated age  Head:    Normocephalic, without obvious abnormality, atraumatic  Eyes:    PERRL, conjunctiva/corneas clear, EOM's intact, both eyes  Ears:    Normal external ear canals, both ears  Nose:   Nares normal, septum midline, mucosa normal, no drainage or sinus tenderness  Throat:   Lips, mucosa, and tongue normal; teeth and gums normal  Neck:   Supple, symmetrical, trachea midline, no adenopathy; thyroid: no enlargement/tenderness/nodules; no carotid bruit or JVD  Back:     Symmetric, no curvature, ROM normal, no CVA tenderness  Lungs:     Clear to auscultation bilaterally, respirations unlabored  Chest Wall:    No tenderness or deformity   Heart:    Regular rate and rhythm, S1 and S2 normal, no murmur, rub or gallop  Breast Exam:    No tenderness, masses, or nipple abnormality  Abdomen:     Soft,  non-tender, bowel sounds active all four quadrants, no masses, no organomegaly.    Genitalia:    Pelvic:external genitalia normal, vagina without lesions, discharge, or tenderness, rectovaginal septum  normal. Cervix normal in appearance, no cervical motion tenderness, no adnexal masses or tenderness.  Uterus normal size, shape, mobile, regular contours, nontender.  Rectal:    Normal external sphincter.  No hemorrhoids appreciated. Internal exam not done.   Extremities:   Extremities normal, atraumatic, no cyanosis or edema  Pulses:   2+ and symmetric all extremities  Skin:   Skin color, texture, turgor normal, no rashes or lesions  Lymph nodes:   Cervical, supraclavicular, and axillary nodes normal  Neurologic:   CNII-XII intact, normal strength, sensation and reflexes throughout     Labs:  Lab Results  Component Value Date   WBC 10.9 (H) 06/12/2022   HGB 12.5 06/12/2022   HCT 39.2 06/12/2022   MCV 81 06/12/2022   PLT 330 06/12/2022  Lab Results  Component Value Date   CREATININE 0.92 06/12/2022   BUN 12 06/12/2022   NA 139 06/12/2022   K 4.0 06/12/2022   CL 107 (H) 06/12/2022   CO2 21 06/12/2022    Lab Results  Component Value Date   ALT 16 06/12/2022   AST 25 06/12/2022   ALKPHOS 72 06/12/2022   BILITOT 0.4 06/12/2022    Lab Results  Component Value Date   TSH 2.780 10/14/2018     Assessment:   1. Encounter for well woman exam with routine gynecological exam   2. Encounter for hepatitis C screening test for low risk patient   3. Screening for diabetes mellitus (DM)   4. Screening cholesterol level   5. Family history of cancer   6. Class 3 severe obesity without serious comorbidity with body mass index (BMI) of 45.0 to 49.9 in adult, unspecified obesity type (HCC)      Plan:  Blood tests: Ordered today. Breast self exam technique reviewed and patient encouraged to perform self-exam monthly. Contraception:  Phexxi . Discussed healthy lifestyle  modifications. Mammogram  : based on family history, would recommend baseline screening at age 69. Order placed.  Pap smear  UTD . Discussion had regarding patient's family history of cancer.  Offered [employer] screening. Performed today.  Need for 1 time screening of Hepatitis C for low risk patient. Ordered today.  Follow up in 1 year for annual exam    Hildred Laser, MD [facility]

## 2023-05-13 ENCOUNTER — Ambulatory Visit (INDEPENDENT_AMBULATORY_CARE_PROVIDER_SITE_OTHER): Payer: BC Managed Care – PPO | Admitting: Obstetrics and Gynecology

## 2023-05-13 ENCOUNTER — Encounter: Payer: Self-pay | Admitting: Obstetrics and Gynecology

## 2023-05-13 VITALS — BP 101/65 | HR 75 | Resp 16 | Ht 63.0 in | Wt 276.7 lb

## 2023-05-13 DIAGNOSIS — Z01419 Encounter for gynecological examination (general) (routine) without abnormal findings: Secondary | ICD-10-CM

## 2023-05-13 DIAGNOSIS — Z13220 Encounter for screening for lipoid disorders: Secondary | ICD-10-CM

## 2023-05-13 DIAGNOSIS — Z809 Family history of malignant neoplasm, unspecified: Secondary | ICD-10-CM

## 2023-05-13 DIAGNOSIS — Z1159 Encounter for screening for other viral diseases: Secondary | ICD-10-CM

## 2023-05-13 DIAGNOSIS — Z131 Encounter for screening for diabetes mellitus: Secondary | ICD-10-CM

## 2023-05-13 DIAGNOSIS — E66813 Obesity, class 3: Secondary | ICD-10-CM

## 2023-05-14 LAB — COMPREHENSIVE METABOLIC PANEL
ALT: 8 IU/L (ref 0–32)
AST: 11 IU/L (ref 0–40)
Albumin/Globulin Ratio: 1.4
Albumin: 4 g/dL (ref 3.9–4.9)
Alkaline Phosphatase: 59 IU/L (ref 44–121)
BUN/Creatinine Ratio: 16 (ref 9–23)
BUN: 13 mg/dL (ref 6–20)
Bilirubin Total: 0.3 mg/dL (ref 0.0–1.2)
CO2: 22 mmol/L (ref 20–29)
Calcium: 9 mg/dL (ref 8.7–10.2)
Chloride: 108 mmol/L — ABNORMAL HIGH (ref 96–106)
Creatinine, Ser: 0.83 mg/dL (ref 0.57–1.00)
Globulin, Total: 2.9 g/dL (ref 1.5–4.5)
Glucose: 66 mg/dL — ABNORMAL LOW (ref 70–99)
Potassium: 4.1 mmol/L (ref 3.5–5.2)
Sodium: 140 mmol/L (ref 134–144)
Total Protein: 6.9 g/dL (ref 6.0–8.5)
eGFR: 94 mL/min/{1.73_m2} (ref >59)

## 2023-05-14 LAB — CBC
Hematocrit: 37.9 % (ref 34.0–46.6)
Hemoglobin: 12.2 g/dL (ref 11.1–15.9)
MCH: 25.4 pg — ABNORMAL LOW (ref 26.6–33.0)
MCHC: 32.2 g/dL (ref 31.5–35.7)
MCV: 79 fL (ref 79–97)
Platelets: 300 10*3/uL (ref 150–450)
RBC: 4.8 x10E6/uL (ref 3.77–5.28)
RDW: 14.6 % (ref 11.7–15.4)
WBC: 8.6 10*3/uL (ref 3.4–10.8)

## 2023-05-14 LAB — LIPID PANEL
Chol/HDL Ratio: 3.5 ratio (ref 0.0–4.4)
Cholesterol, Total: 175 mg/dL (ref 100–199)
HDL: 50 mg/dL (ref >39)
LDL Chol Calc (NIH): 100 mg/dL — ABNORMAL HIGH (ref 0–99)
Triglycerides: 144 mg/dL (ref 0–149)
VLDL Cholesterol Cal: 25 mg/dL (ref 5–40)

## 2023-05-14 LAB — HEMOGLOBIN A1C
Est. average glucose Bld gHb Est-mCnc: 120 mg/dL
Hgb A1c MFr Bld: 5.8 % — ABNORMAL HIGH (ref 4.8–5.6)

## 2023-05-14 LAB — TSH: TSH: 1.7 u[IU]/mL (ref 0.450–4.500)

## 2023-05-15 ENCOUNTER — Encounter: Payer: Self-pay | Admitting: Obstetrics and Gynecology

## 2023-05-21 ENCOUNTER — Encounter: Payer: Self-pay | Admitting: Obstetrics and Gynecology

## 2023-05-21 ENCOUNTER — Ambulatory Visit
Admission: RE | Admit: 2023-05-21 | Discharge: 2023-05-21 | Disposition: A | Discharge status: Home / Self Care | Payer: BC Managed Care – PPO | Source: Ambulatory Visit | Attending: Obstetrics and Gynecology | Admitting: Obstetrics and Gynecology

## 2023-05-21 DIAGNOSIS — Z1231 Encounter for screening mammogram for malignant neoplasm of breast: Secondary | ICD-10-CM | POA: Insufficient documentation

## 2023-05-21 DIAGNOSIS — Z01419 Encounter for gynecological examination (general) (routine) without abnormal findings: Secondary | ICD-10-CM | POA: Insufficient documentation

## 2023-05-25 ENCOUNTER — Other Ambulatory Visit: Payer: Self-pay | Admitting: Obstetrics and Gynecology

## 2023-05-25 DIAGNOSIS — R928 Other abnormal and inconclusive findings on diagnostic imaging of breast: Secondary | ICD-10-CM

## 2023-05-25 DIAGNOSIS — N6489 Other specified disorders of breast: Secondary | ICD-10-CM

## 2023-05-31 ENCOUNTER — Other Ambulatory Visit: Payer: Self-pay | Admitting: Obstetrics and Gynecology

## 2023-05-31 DIAGNOSIS — R928 Other abnormal and inconclusive findings on diagnostic imaging of breast: Secondary | ICD-10-CM

## 2023-06-04 ENCOUNTER — Ambulatory Visit: Admission: RE | Admit: 2023-06-04 | Payer: BC Managed Care – PPO | Source: Ambulatory Visit

## 2023-06-04 ENCOUNTER — Ambulatory Visit
Admission: RE | Admit: 2023-06-04 | Discharge: 2023-06-04 | Disposition: A | Discharge status: Home / Self Care | Payer: BC Managed Care – PPO | Source: Ambulatory Visit | Attending: Obstetrics and Gynecology | Admitting: Obstetrics and Gynecology

## 2023-06-04 DIAGNOSIS — R928 Other abnormal and inconclusive findings on diagnostic imaging of breast: Secondary | ICD-10-CM

## 2023-06-04 DIAGNOSIS — N6489 Other specified disorders of breast: Secondary | ICD-10-CM

## 2023-06-14 ENCOUNTER — Encounter: Payer: Self-pay | Admitting: Obstetrics and Gynecology

## 2023-06-23 ENCOUNTER — Encounter: Payer: Self-pay | Admitting: Obstetrics and Gynecology

## 2023-06-25 ENCOUNTER — Encounter: Payer: Self-pay | Admitting: Family Medicine

## 2023-06-25 ENCOUNTER — Ambulatory Visit (INDEPENDENT_AMBULATORY_CARE_PROVIDER_SITE_OTHER): Payer: BC Managed Care – PPO | Admitting: Family Medicine

## 2023-06-25 ENCOUNTER — Encounter: Payer: BC Managed Care – PPO | Admitting: Nurse Practitioner

## 2023-06-25 VITALS — BP 129/89 | HR 92 | Ht 63.0 in | Wt 279.0 lb

## 2023-06-25 DIAGNOSIS — R3915 Urgency of urination: Secondary | ICD-10-CM | POA: Diagnosis not present

## 2023-06-25 DIAGNOSIS — Z0001 Encounter for general adult medical examination with abnormal findings: Secondary | ICD-10-CM

## 2023-06-25 DIAGNOSIS — R7303 Prediabetes: Secondary | ICD-10-CM | POA: Diagnosis not present

## 2023-06-25 DIAGNOSIS — Z0000 Encounter for general adult medical examination without abnormal findings: Secondary | ICD-10-CM

## 2023-06-25 LAB — MICROSCOPIC EXAMINATION
Epithelial Cells (non renal): NONE SEEN /hpf (ref 0–10)
RBC, Urine: NONE SEEN /hpf (ref 0–2)
Renal Epithel, UA: NONE SEEN /hpf
WBC, UA: NONE SEEN /hpf (ref 0–5)

## 2023-06-25 LAB — URINALYSIS, ROUTINE W REFLEX MICROSCOPIC
Bilirubin, UA: NEGATIVE
Glucose, UA: NEGATIVE
Ketones, UA: NEGATIVE
Leukocytes,UA: NEGATIVE
Nitrite, UA: NEGATIVE
Protein,UA: NEGATIVE
Specific Gravity, UA: 1.015 (ref 1.005–1.030)
Urobilinogen, Ur: 0.2 mg/dL (ref 0.2–1.0)
pH, UA: 5.5 (ref 5.0–7.5)

## 2023-06-25 MED ORDER — CEPHALEXIN 500 MG PO CAPS: 500.0000 mg | ORAL_CAPSULE | Freq: Two times a day (BID) | ORAL | 0 refills | Status: AC

## 2023-06-25 NOTE — Patient Instructions (Addendum)
Call insurance about Wegovy or Zepbound for weight loss.   Health Maintenance, Female Adopting a healthy lifestyle and getting preventive care are important in promoting health and wellness. Ask your health care provider about: The right schedule for you to have regular tests and exams. Things you can do on your own to prevent diseases and keep yourself healthy. What should I know about diet, weight, and exercise? Eat a healthy diet  Eat a diet that includes plenty of vegetables, fruits, low-fat dairy products, and lean protein. Do not eat a lot of foods that are high in solid fats, added sugars, or sodium. Maintain a healthy weight Body mass index (BMI) is used to identify weight problems. It estimates body fat based on height and weight. Your health care provider can help determine your BMI and help you achieve or maintain a healthy weight. Get regular exercise Get regular exercise. This is one of the most important things you can do for your health. Most adults should: Exercise for at least 150 minutes each week. The exercise should increase your heart rate and make you sweat (moderate-intensity exercise). Do strengthening exercises at least twice a week. This is in addition to the moderate-intensity exercise. Spend less time sitting. Even light physical activity can be beneficial. Watch cholesterol and blood lipids Have your blood tested for lipids and cholesterol at 36 years of age, then have this test every 5 years. Have your cholesterol levels checked more often if: Your lipid or cholesterol levels are high. You are older than 36 years of age. You are at high risk for heart disease. What should I know about cancer screening? Depending on your health history and family history, you may need to have cancer screening at various ages. This may include screening for: Breast cancer. Cervical cancer. Colorectal cancer. Skin cancer. Lung cancer. What should I know about heart disease,  diabetes, and high blood pressure? Blood pressure and heart disease High blood pressure causes heart disease and increases the risk of stroke. This is more likely to develop in people who have high blood pressure readings or are overweight. Have your blood pressure checked: Every 3-5 years if you are 63-76 years of age. Every year if you are 68 years old or older. Diabetes Have regular diabetes screenings. This checks your fasting blood sugar level. Have the screening done: Once every three years after age 39 if you are at a normal weight and have a low risk for diabetes. More often and at a younger age if you are overweight or have a high risk for diabetes. What should I know about preventing infection? Hepatitis B If you have a higher risk for hepatitis B, you should be screened for this virus. Talk with your health care provider to find out if you are at risk for hepatitis B infection. Hepatitis C Testing is recommended for: Everyone born from [birth year] through 1964. Anyone with known risk factors for hepatitis C. Sexually transmitted infections (STIs) Get screened for STIs, including gonorrhea and chlamydia, if: You are sexually active and are younger than 36 years of age. You are older than 36 years of age and your health care provider tells you that you are at risk for this type of infection. Your sexual activity has changed since you were last screened, and you are at increased risk for chlamydia or gonorrhea. Ask your health care provider if you are at risk. Ask your health care provider about whether you are at high risk for HIV. Your health  care provider may recommend a prescription medicine to help prevent HIV infection. If you choose to take medicine to prevent HIV, you should first get tested for HIV. You should then be tested every 3 months for as long as you are taking the medicine. Pregnancy If you are about to stop having your period (premenopausal) and you may become pregnant,  seek counseling before you get pregnant. Take 400 to 800 micrograms (mcg) of folic acid every day if you become pregnant. Ask for birth control (contraception) if you want to prevent pregnancy. Osteoporosis and menopause Osteoporosis is a disease in which the bones lose minerals and strength with aging. This can result in bone fractures. If you are 49 years old or older, or if you are at risk for osteoporosis and fractures, ask your health care provider if you should: Be screened for bone loss. Take a calcium or vitamin D supplement to lower your risk of fractures. Be given hormone replacement therapy (HRT) to treat symptoms of menopause. Follow these instructions at home: Alcohol use Do not drink alcohol if: Your health care provider tells you not to drink. You are pregnant, may be pregnant, or are planning to become pregnant. If you drink alcohol: Limit how much you have to: 0-1 drink a day. Know how much alcohol is in your drink. In the [place] one drink equals one 12 oz bottle of beer (355 mL), one 5 oz glass of wine (148 mL), or one 1 oz glass of hard liquor (44 mL). Lifestyle Do not use any products that contain nicotine or tobacco. These products include cigarettes, chewing tobacco, and vaping devices, such as e-cigarettes. If you need help quitting, ask your health care provider. Do not use street drugs. Do not share needles. Ask your health care provider for help if you need support or information about quitting drugs. General instructions Schedule regular health, dental, and eye exams. Stay current with your vaccines. Tell your health care provider if: You often feel depressed. You have ever been abused or do not feel safe at home. Summary Adopting a healthy lifestyle and getting preventive care are important in promoting health and wellness. Follow your health care provider's instructions about healthy diet, exercising, and getting tested or screened for diseases. Follow your  health care provider's instructions on monitoring your cholesterol and blood pressure. This information is not intended to replace advice given to you by your health care provider. Make sure you discuss any questions you have with your health care provider. Document Revised: 04/07/2021 Document Reviewed: 04/07/2021 Elsevier Patient Education  2024 [employer].

## 2023-06-25 NOTE — Progress Notes (Signed)
Complete physical exam  Patient: Anna Baldwin   DOB: [birth year]   36 y.o. Female  MRN: [id number]  Subjective:    Chief Complaint  Patient presents with   Annual Exam    Anna Baldwin is a 37 y.o. female who presents today for a complete physical exam. She reports consuming a general diet. The patient does not participate in regular exercise at present. She generally feels well. She reports sleeping fairly well. She does have additional problems to discuss today.   She reports that she has been getting UTIs every 2 months or so. Usually sees virtual provider so there is no testing done. She is currently have some left sided lower back pain. This is how her UTI typically present. She does have chronic urgency with some urge incontinence. No dysuria, frequency, or hematuria. Denies difficulty voiding. No nocturia. No fever or chills. She will be going on vacation in 2 days. Recently had pelvic exam.   She has tried various diet with short term success. She does stress eat. She does eat a lot of processed foods and sugar due to a busy schedule. She has not been exercising. She has never tried medications. Never seen nutrition. Not interested in weight loss surgery. Recently had labs with GYN.   PAP 2 years ago with GYN.   Most recent fall risk assessment:    05/13/2023    9:15 AM  Fall Risk   Falls in the past year? 0  Number falls in past yr: 0  Injury with Fall? 0  Risk for fall due to : No Fall Risks  Follow up Falls evaluation completed     Most recent depression screenings:    05/13/2023    9:15 AM 07/28/2022    4:18 PM  PHQ 2/9 Scores  PHQ - 2 Score 0 0  PHQ- 9 Score  0        Patient Care Team: Gabriel Earing, FNP as PCP - General (Family Medicine)   No outpatient medications prior to visit.   No facility-administered medications prior to visit.    ROS Negative unless specially indicated above in HPI.      Objective:     BP  129/89   Pulse 92   Ht 5\' 3"  (1.6 m)   Wt 279 lb (126.6 kg)   LMP 06/16/2023 (Exact Date)   SpO2 98%   BMI 49.42 kg/m  Wt Readings from Last 3 Encounters:  06/25/23 279 lb (126.6 kg)  05/13/23 276 lb 11.2 oz (125.5 kg)  07/20/22 246 lb 14.6 oz (112 kg)      Physical Exam Vitals and nursing note reviewed.  Constitutional:      General: She is not in acute distress.    Appearance: Normal appearance. She is obese. She is not ill-appearing, toxic-appearing or diaphoretic.  HENT:     Head: Normocephalic.     Right Ear: Tympanic membrane, ear canal and external ear normal.     Left Ear: Tympanic membrane, ear canal and external ear normal.     Nose: Nose normal.     Mouth/Throat:     Mouth: Mucous membranes are dry.     Pharynx: Oropharynx is clear.  Eyes:     Extraocular Movements: Extraocular movements intact.     Conjunctiva/sclera: Conjunctivae normal.     Pupils: Pupils are equal, round, and reactive to light.  Neck:     Thyroid: No thyroid mass, thyromegaly or thyroid tenderness.  Cardiovascular:  Rate and Rhythm: Normal rate and regular rhythm.     Pulses: Normal pulses.     Heart sounds: Normal heart sounds. No murmur heard.    No friction rub. No gallop.  Pulmonary:     Effort: Pulmonary effort is normal.     Breath sounds: Normal breath sounds.  Abdominal:     General: Bowel sounds are normal. There is no distension.     Palpations: Abdomen is soft. There is no mass.     Tenderness: There is no abdominal tenderness. There is no right CVA tenderness, left CVA tenderness, guarding or rebound.  Musculoskeletal:     Cervical back: Normal range of motion and neck supple. No tenderness.     Right lower leg: No edema.     Left lower leg: No edema.  Lymphadenopathy:     Cervical: No cervical adenopathy.  Skin:    General: Skin is warm and dry.     Capillary Refill: Capillary refill takes less than 2 seconds.     Findings: No lesion or rash.  Neurological:      General: No focal deficit present.     Mental Status: She is alert and oriented to person, place, and time.     Cranial Nerves: No cranial nerve deficit.     Motor: No weakness.     Coordination: Coordination normal.     Gait: Gait normal.  Psychiatric:        Mood and Affect: Mood normal.        Behavior: Behavior normal.        Thought Content: Thought content normal.        Judgment: Judgment normal.      No results found for any visits on 06/25/23.     Assessment & Plan:    Routine Health Maintenance and Physical Exam  Kitti was seen today for annual exam.  Diagnoses and all orders for this visit:  Routine general medical examination at a health care facility  Urinary urgency UA negative is essentially negative today. I will send in keflex for her to start if symptoms worsen while on vacation pending culture results.  -     Urinalysis, Routine w reflex microscopic -     Urine Culture  Morbid obesity [facility]) Referral for nutrition counseling. She will contact her insurance regarding coverage for wegovy or zepbound.  -     Amb ref to Medical Nutrition Therapy-MNT  Prediabetes Diet, exercise, weight loss.    Immunization History  Administered Date(s) Administered   Influenza,inj,Quad PF,6+ Mos 10/05/2019   Moderna Sars-Covid-2 Vaccination 05/08/2020, 06/05/2020   Tdap 12/14/2016, 01/04/2020    Health Maintenance  Topic Date Due   Hepatitis C Screening  Never done   COVID-19 Vaccine (3 - 2023-24 season) 07/31/2022   INFLUENZA VACCINE  07/01/2023   PAP SMEAR-Modifier  06/10/2024   DTaP/Tdap/Td (3 - Td or Tdap) 01/03/2030   HIV Screening  Completed   HPV VACCINES  Aged Out    Discussed health benefits of physical activity, and encouraged her to engage in regular exercise appropriate for her age and condition.  Problem List Items Addressed This Visit       Other   Morbid obesity (HCC)   Relevant Orders   Amb ref to Medical Nutrition Therapy-MNT    Prediabetes   Other Visit Diagnoses     Routine general medical examination at a health care facility    -  Primary   Urinary urgency  Relevant Medications   cephALEXin (KEFLEX) 500 MG capsule   Other Relevant Orders   Urinalysis, Routine w reflex microscopic   Urine Culture      Return in about 1 year (around 06/24/2024) for CPE.   The patient indicates understanding of these issues and agrees with the plan.  Gabriel Earing, FNP

## 2023-07-08 ENCOUNTER — Encounter: Payer: BC Managed Care – PPO | Admitting: Nutrition

## 2023-07-15 ENCOUNTER — Encounter: Payer: Self-pay | Admitting: Family Medicine

## 2023-07-15 ENCOUNTER — Ambulatory Visit: Payer: BC Managed Care – PPO | Admitting: Family Medicine

## 2023-09-06 ENCOUNTER — Encounter: Payer: BC Managed Care – PPO | Attending: Family Medicine | Admitting: Nutrition

## 2024-05-03 ENCOUNTER — Other Ambulatory Visit: Payer: Self-pay | Admitting: Family Medicine

## 2024-05-03 DIAGNOSIS — Z1231 Encounter for screening mammogram for malignant neoplasm of breast: Secondary | ICD-10-CM

## 2024-06-05 ENCOUNTER — Ambulatory Visit: Payer: Self-pay

## 2024-06-09 ENCOUNTER — Ambulatory Visit
Admission: RE | Admit: 2024-06-09 | Discharge: 2024-06-09 | Disposition: A | Discharge status: Home / Self Care | Payer: Self-pay | Source: Ambulatory Visit | Attending: Family Medicine

## 2024-06-09 DIAGNOSIS — Z1231 Encounter for screening mammogram for malignant neoplasm of breast: Secondary | ICD-10-CM

## 2024-07-07 ENCOUNTER — Encounter: Payer: BC Managed Care – PPO | Admitting: Family Medicine

## 2024-10-02 ENCOUNTER — Emergency Department

## 2024-10-02 ENCOUNTER — Other Ambulatory Visit: Payer: Self-pay

## 2024-10-02 ENCOUNTER — Emergency Department
Admission: EM | Admit: 2024-10-02 | Discharge: 2024-10-02 | Disposition: A | Discharge status: Home / Self Care | Attending: Emergency Medicine | Admitting: Emergency Medicine

## 2024-10-02 DIAGNOSIS — M79604 Pain in right leg: Secondary | ICD-10-CM | POA: Insufficient documentation

## 2024-10-02 NOTE — Progress Notes (Signed)
 This note has been created using automated tools and reviewed for accuracy by [facility].  Consent was obtained.  Subjective:   Anna Baldwin is a 38 y.o. female in today for  History of Present Illness Anna Baldwin is a 38 year old female who presents with a tender lump on the back of her right upper calf.  A few days ago, she noticed a tender lump on the back of her calf while husband was massaging her leg.  States she had severe pain and he noticed the area of swelling which has not really changed since.  She does not believe the lump is a bruise,. There is no numbness or tingling in the calf. She has not applied any treatments or experienced any trauma to the area. No chest pain or shortness of breath.  Outpatient Medications Prior to Visit  Medication Sig Dispense Refill  . multivitamin tablet Take 1 tablet by mouth once daily.    SABRA albuterol 90 mcg/actuation inhaler Inhale 1 inhalation into the lungs every 4 (four) hours as needed for Wheezing for up to 30 days (Patient not taking: Reported on 01/02/2023) 1 Inhaler 0   No facility-administered medications prior to visit.    The patients PMH, PSH, social history, allergies and medications were reviewed. Updates were made in those sections.   Objective:   Vitals:   10/02/24 1741  BP: 116/73  Pulse: 79  Resp: 12  Temp: 36.9 C (98.5 F)  TempSrc: Oral  SpO2: 100%  Weight: (!) 126 kg (277 lb 12.5 oz)  Height: 160 cm (5' 3)    General:   Alert and oriented, no acute distress, appropriate Skin:   No rashes, normal color, warm and dry; tender swollen area to the proximal medial calf with mild phlebitis appreciated.  No increased pain with range of motion of the knee or ankle Eyes:  Conjunctiva clear Lungs:   Comfortably breathing Extremities:    No cyanosis; localized swelling as noted above.  Good range of motion of the knee and ankle on the right side Neurologic:   Alert and oriented; moving all extremities.   Sensation intact  CBC: WBC 11.7, MCV 77, MCH 24.6, RDW-CV 15.7 D dimer:   673  Discussed need to send patient to the emergency department for further evaluation and management given concern of her left lower leg pain/swelling and a positive D-dimer.  She plans to go to [facility] for an ultrasound.  MDM: High, patient with an acute concern with potential threat to bodily function and/or life needing ED transfer  Assessment/Plan:   Assessment:  Swelling of calf  (primary encounter diagnosis) -location and superficial nature are more concerning for thrombophlebitis versus ruptured varicosity.  However given elevated D-dimer she will need ultrasound to rule out DVT though it could be falsely elevated due to trauma of the vein  Plan:  Your current condition warrants further evaluation and/or treatment which exceed services available to you in this urgent care setting. I have discussed with you your currrent condition and the need for further evaluation and/or treatment in an emergency department setting. In response to my medical recommendation, you have opted to go to the emergency department at: [facility] ED The emergency department at this facility has been notified of your current condition via chart and the need for transfer to their setting.    This is an ESTABLISHED Patient with [facility].

## 2024-10-02 NOTE — ED Provider Notes (Signed)
 [facility] Provider Note    Event Date/Time   First MD Initiated Contact with Patient 10/02/24 1949     (approximate)   History   Chief Complaint Leg Pain   HPI  Anna Baldwin is a 38 y.o. female with no significant past medical history presents to the ED complaining of leg pain.  Patient reports that her husband was giving her a leg massager earlier today and noticed a lump to the upper portion of her calf.  She reports some mild tenderness to this area but denies any recent trauma.  She has not noticed any erythema, warmth, or wounds to the area.  She was initially evaluated at urgent care, found to have elevated D-dimer and sent to the ED for further evaluation.  She denies any history of DVT and has not had any chest pain or shortness of breath.     Physical Exam   Triage Vital Signs: ED Triage Vitals  Encounter Vitals Group     BP 10/02/24 1921 129/84     Girls Systolic BP Percentile --      Girls Diastolic BP Percentile --      Boys Systolic BP Percentile --      Boys Diastolic BP Percentile --      Pulse Rate 10/02/24 1921 79     Resp 10/02/24 1921 17     Temp 10/02/24 1921 98.6 F (37 C)     Temp src --      SpO2 10/02/24 1921 98 %     Weight 10/02/24 1920 277 lb (125.6 kg)     Height 10/02/24 1920 5' 3 (1.6 m)     Head Circumference --      Peak Flow --      Pain Score 10/02/24 1920 0     Pain Loc --      Pain Education --      Exclude from Growth Chart --     Most recent vital signs: Vitals:   10/02/24 1921  BP: 129/84  Pulse: 79  Resp: 17  Temp: 98.6 F (37 C)  SpO2: 98%    Constitutional: Alert and oriented. Eyes: Conjunctivae are normal. Head: Atraumatic. Nose: No congestion/rhinnorhea. Mouth/Throat: Mucous membranes are moist.  Cardiovascular: Normal rate, regular rhythm. Grossly normal heart sounds.  2+ radial and DP pulses bilaterally. Respiratory: Normal respiratory effort.  No retractions. Lungs  CTAB. Gastrointestinal: Soft and nontender. No distention. Musculoskeletal: Small nodule noted to right upper calf without associated erythema, warmth, or tenderness. Neurologic:  Normal speech and language. No gross focal neurologic deficits are appreciated.    ED Results / Procedures / Treatments   Labs (all labs ordered are listed, but only abnormal results are displayed) Labs Reviewed - No data to display   RADIOLOGY Lower extremity ultrasound reviewed and interpreted by me with no evidence of DVT.  PROCEDURES:  Critical Care performed: No  Procedures   MEDICATIONS ORDERED IN ED: Medications - No data to display   IMPRESSION / MDM / ASSESSMENT AND PLAN / ED COURSE  I reviewed the triage vital signs and the nursing notes.                              38 y.o. female with no significant past medical history who presents to the ED complaining of tender nodule to her right upper calf that she noticed earlier today.  Patient's presentation is most  consistent with acute complicated illness / injury requiring diagnostic workup.  Differential diagnosis includes, but is not limited to, DVT, Baker's cyst, cellulitis, abscess, muscle strain, lipoma.  Patient well-appearing and in no acute distress, vital signs are unremarkable.  She has a small nodule that is tender to palpation, but with no signs of infection.  Given elevated D-dimer at urgent care, will check ultrasound for evidence of DVT.  She is neurovascularly intact distally with strong DP pulse, no findings concerning for infectious process.  Ultrasound is negative for DVT and patient appropriate for outpatient follow-up with PCP, was counseled to return to the ED for new or worsening symptoms.  Patient agrees with plan.      FINAL CLINICAL IMPRESSION(S) / ED DIAGNOSES   Final diagnoses:  Right leg pain     Rx / DC Orders   ED Discharge Orders     None        Note:  This document was prepared using  Dragon voice recognition software and may include unintentional dictation errors.   Willo Dunnings, MD 10/02/24 2228

## 2024-10-02 NOTE — ED Notes (Signed)
 See triage note. Distal pedal pulses intact, cap refill <3 sec. Pt A&Ox4, NAD, ambulatory without assistance.

## 2024-10-02 NOTE — ED Triage Notes (Signed)
 Pt reports lower right leg pain since yesterday, pt reports she noticed a lump to the back of her lower leg. Pt was sent here from [facility] after they did a D Dimer and told her it was elevated and to come here for US 

## 2024-10-16 ENCOUNTER — Ambulatory Visit: Admitting: Family Medicine

## 2024-10-16 ENCOUNTER — Encounter: Payer: Self-pay | Admitting: Family Medicine

## 2024-10-16 VITALS — BP 118/70 | HR 78 | Temp 97.7°F | Ht 63.0 in | Wt 279.6 lb

## 2024-10-16 DIAGNOSIS — R2241 Localized swelling, mass and lump, right lower limb: Secondary | ICD-10-CM | POA: Diagnosis not present

## 2024-10-16 DIAGNOSIS — Z23 Encounter for immunization: Secondary | ICD-10-CM | POA: Diagnosis not present

## 2024-10-16 NOTE — Progress Notes (Signed)
 Acute Office Visit  Subjective:     Patient ID: Anna Baldwin, female    DOB: [birth year], 38 y.o.   MRN: [id number]  Chief Complaint  Patient presents with   Cyst    HPI  History of Present Illness   Anna Baldwin is a 38 year old female who presents with a lump in the back of her right knee.  She noticed a lump in the back of her right lower leg, just below the knee on November 3rd, first felt by her husband during a leg massage. She visited urgent care and was advised to go to the ER to rule out a blood clot due to localized unilateral swelling. She also had pain to the area. A vascular ultrasound was performed in the ER, which ruled out a blood clot. There was no other findings noted on the DVT US .   Her husband mentioned that the lump seems to have gotten smaller. There is no pain now. No warmth or tenderness to the area. No tenderness, soreness, warmth, numbness, or tingling in the knee or below. No history of recent injury to the knee, although she had a PCL tear in the same knee about two years ago, which did not require surgery.  No fever, and she has not had any trauma to her leg on anything recently. She is very concerned about the lump and would like additional work up.       ROS As per HPI.      Objective:    BP 118/70   Pulse 78   Temp 97.7 F (36.5 C) (Temporal)   Ht 5' 3 (1.6 m)   Wt 279 lb 9.6 oz (126.8 kg)   SpO2 100%   BMI 49.53 kg/m    Physical Exam Vitals and nursing note reviewed.  Constitutional:      General: She is not in acute distress.    Appearance: She is obese. She is not ill-appearing, toxic-appearing or diaphoretic.  Pulmonary:     Effort: Pulmonary effort is normal. No respiratory distress.  Musculoskeletal:     Right lower leg: No edema.     Left lower leg: Swelling (localized mild swelling. Non-discrete grape size lump palpated to proximal posterior medial right lower leg. No warmth, erythema, tenderness,  or induation) present. No deformity, lacerations, tenderness or bony tenderness. No edema.       Legs:  Skin:    General: Skin is warm and dry.  Neurological:     General: No focal deficit present.     Mental Status: She is alert and oriented to person, place, and time.  Psychiatric:        Mood and Affect: Mood normal.        Behavior: Behavior normal.     No results found for any visits on 10/16/24.      Assessment & Plan:   Anna Baldwin was seen today for cyst.  Diagnoses and all orders for this visit:  Localized swelling, mass, or lump of right lower extremity -     US  RT LOWER EXTREM LTD SOFT TISSUE NON VASCULAR  Encounter for immunization -     Flu vaccine trivalent PF, 6mos and older(Flulaval,Afluria,Fluarix,Fluzone)   Assessment and Plan    Localized lump, right lower leg Vascular ultrasound ruled out blood clot. Suspect hematoma vs Baker's cyst vs lipoma.  - Ordered soft tissue ultrasound of right lower leg. - Advised warm compresses or heating pad for 20 minutes to reduce inflammation. -  Recommended compression      Return if symptoms worsen or fail to improve. Schedule CPE.  The patient indicates understanding of these issues and agrees with the plan.  Anna CHRISTELLA Search, FNP

## 2024-10-18 ENCOUNTER — Ambulatory Visit
Admission: RE | Admit: 2024-10-18 | Discharge: 2024-10-18 | Disposition: A | Discharge status: Home / Self Care | Source: Ambulatory Visit | Attending: Family Medicine | Admitting: Family Medicine

## 2024-10-23 ENCOUNTER — Ambulatory Visit: Payer: Self-pay | Admitting: Family Medicine

## 2024-12-18 ENCOUNTER — Encounter: Admitting: Family Medicine

## 2025-01-04 ENCOUNTER — Encounter: Admitting: Family Medicine

## 2025-02-15 ENCOUNTER — Encounter: Admitting: Family Medicine

## 2025-05-21 ENCOUNTER — Encounter: Admitting: Family Medicine
